# Patient Record
Sex: Male | Born: 1955 | Race: Black or African American | Hispanic: No | Marital: Single | Smoking: Never smoker
Health system: Southern US, Community
[De-identification: ages and names within clinical notes are randomized; demographics above are authoritative.]

## PROBLEM LIST (undated history)

## (undated) DIAGNOSIS — N529 Male erectile dysfunction, unspecified: Secondary | ICD-10-CM

## (undated) DIAGNOSIS — M21619 Bunion of unspecified foot: Secondary | ICD-10-CM

## (undated) DIAGNOSIS — K219 Gastro-esophageal reflux disease without esophagitis: Secondary | ICD-10-CM

## (undated) DIAGNOSIS — Z860100 Personal history of colon polyps, unspecified: Secondary | ICD-10-CM

## (undated) DIAGNOSIS — S82842A Displaced bimalleolar fracture of left lower leg, initial encounter for closed fracture: Secondary | ICD-10-CM

## (undated) DIAGNOSIS — K759 Inflammatory liver disease, unspecified: Secondary | ICD-10-CM

## (undated) DIAGNOSIS — J309 Allergic rhinitis, unspecified: Secondary | ICD-10-CM

## (undated) DIAGNOSIS — M109 Gout, unspecified: Secondary | ICD-10-CM

## (undated) DIAGNOSIS — M1990 Unspecified osteoarthritis, unspecified site: Secondary | ICD-10-CM

## (undated) DIAGNOSIS — E785 Hyperlipidemia, unspecified: Secondary | ICD-10-CM

## (undated) HISTORY — DX: Allergic rhinitis, unspecified: J30.9

## (undated) HISTORY — PX: FOOT SURGERY: SHX648

## (undated) HISTORY — DX: Gastro-esophageal reflux disease without esophagitis: K21.9

## (undated) HISTORY — DX: Personal history of colonic polyps: Z86.010

## (undated) HISTORY — DX: Personal history of colon polyps, unspecified: Z86.0100

## (undated) HISTORY — DX: Bunion of unspecified foot: M21.619

## (undated) HISTORY — PX: OTHER SURGICAL HISTORY: SHX169

## (undated) HISTORY — DX: Male erectile dysfunction, unspecified: N52.9

---

## 2005-09-02 ENCOUNTER — Emergency Department (HOSPITAL_COMMUNITY): Admission: EM | Admit: 2005-09-02 | Discharge: 2005-09-02 | Payer: Self-pay | Admitting: Family Medicine

## 2007-08-31 ENCOUNTER — Encounter: Payer: Self-pay | Admitting: Family Medicine

## 2008-03-27 ENCOUNTER — Emergency Department (HOSPITAL_COMMUNITY): Admission: EM | Admit: 2008-03-27 | Discharge: 2008-03-27 | Payer: Self-pay | Admitting: Emergency Medicine

## 2008-04-03 ENCOUNTER — Emergency Department (HOSPITAL_COMMUNITY): Admission: EM | Admit: 2008-04-03 | Discharge: 2008-04-03 | Payer: Self-pay | Admitting: Family Medicine

## 2008-08-25 ENCOUNTER — Ambulatory Visit: Payer: Self-pay | Admitting: Family Medicine

## 2008-08-25 DIAGNOSIS — F329 Major depressive disorder, single episode, unspecified: Secondary | ICD-10-CM | POA: Insufficient documentation

## 2008-09-25 ENCOUNTER — Ambulatory Visit: Payer: Self-pay | Admitting: Family Medicine

## 2008-09-25 DIAGNOSIS — K219 Gastro-esophageal reflux disease without esophagitis: Secondary | ICD-10-CM | POA: Insufficient documentation

## 2008-09-25 DIAGNOSIS — Z8711 Personal history of peptic ulcer disease: Secondary | ICD-10-CM | POA: Insufficient documentation

## 2008-09-25 DIAGNOSIS — M129 Arthropathy, unspecified: Secondary | ICD-10-CM | POA: Insufficient documentation

## 2008-09-29 ENCOUNTER — Encounter: Payer: Self-pay | Admitting: Family Medicine

## 2008-10-09 ENCOUNTER — Telehealth: Payer: Self-pay | Admitting: Family Medicine

## 2009-08-12 ENCOUNTER — Emergency Department (HOSPITAL_COMMUNITY): Admission: EM | Admit: 2009-08-12 | Discharge: 2009-08-12 | Payer: Self-pay | Admitting: Emergency Medicine

## 2009-09-26 ENCOUNTER — Emergency Department (HOSPITAL_COMMUNITY): Admission: EM | Admit: 2009-09-26 | Discharge: 2009-09-26 | Payer: Self-pay | Admitting: Family Medicine

## 2009-11-12 ENCOUNTER — Emergency Department (HOSPITAL_COMMUNITY): Admission: EM | Admit: 2009-11-12 | Discharge: 2009-11-12 | Payer: Self-pay | Admitting: Emergency Medicine

## 2009-11-12 ENCOUNTER — Ambulatory Visit: Payer: Self-pay | Admitting: Cardiology

## 2010-06-29 NOTE — Consult Note (Signed)
Summary: [facility]  [facility]   Imported By: Marylou Mccoy 12/24/2009 08:55:27  _____________________________________________________________________  External Attachment:    Type:   Image     Comment:   External Document

## 2010-07-18 ENCOUNTER — Encounter: Payer: Self-pay | Admitting: *Deleted

## 2010-08-15 LAB — POCT I-STAT, CHEM 8
BUN: 13 mg/dL (ref 6–23)
Calcium, Ion: 1.05 mmol/L — ABNORMAL LOW (ref 1.12–1.32)
Chloride: 106 mEq/L (ref 96–112)
Creatinine, Ser: 1.1 mg/dL (ref 0.4–1.5)
Glucose, Bld: 105 mg/dL — ABNORMAL HIGH (ref 70–99)
HCT: 40 % (ref 39.0–52.0)
Hemoglobin: 13.6 g/dL (ref 13.0–17.0)
Potassium: 3.7 mEq/L (ref 3.5–5.1)
Sodium: 139 mEq/L (ref 135–145)
TCO2: 24 mmol/L (ref 0–100)

## 2010-08-15 LAB — D-DIMER, QUANTITATIVE (NOT AT ARMC): D-Dimer, Quant: 0.35 ug/mL-FEU (ref 0.00–0.48)

## 2010-08-15 LAB — POCT CARDIAC MARKERS
CKMB, poc: 1.6 ng/mL (ref 1.0–8.0)
Myoglobin, poc: 176 ng/mL (ref 12–200)
Troponin i, poc: <0.05 ng/mL (ref 0.00–0.09)

## 2010-08-15 LAB — DIFFERENTIAL
Basophils Absolute: 0 10*3/uL (ref 0.0–0.1)
Basophils Relative: 1 % (ref 0–1)
Eosinophils Absolute: 0.1 10*3/uL (ref 0.0–0.7)
Eosinophils Relative: 3 % (ref 0–5)
Lymphocytes Relative: 35 % (ref 12–46)
Lymphs Abs: 1.7 10*3/uL (ref 0.7–4.0)
Monocytes Absolute: 0.6 10*3/uL (ref 0.1–1.0)
Monocytes Relative: 12 % (ref 3–12)
Neutro Abs: 2.4 10*3/uL (ref 1.7–7.7)
Neutrophils Relative %: 50 % (ref 43–77)

## 2010-08-15 LAB — CBC
HCT: 39.2 % (ref 39.0–52.0)
Hemoglobin: 13.3 g/dL (ref 13.0–17.0)
MCHC: 34 g/dL (ref 30.0–36.0)
MCV: 85.8 fL (ref 78.0–100.0)
Platelets: 162 10*3/uL (ref 150–400)
RBC: 4.57 MIL/uL (ref 4.22–5.81)
RDW: 13.9 % (ref 11.5–15.5)
WBC: 4.9 10*3/uL (ref 4.0–10.5)

## 2010-08-15 LAB — CK TOTAL AND CKMB (NOT AT ARMC)
CK, MB: 2.3 ng/mL (ref 0.3–4.0)
CK, MB: 3.1 ng/mL (ref 0.3–4.0)
Relative Index: 0.2 (ref 0.0–2.5)
Relative Index: 0.2 (ref 0.0–2.5)
Total CK: 1026 U/L — ABNORMAL HIGH (ref 7–232)
Total CK: 1283 U/L — ABNORMAL HIGH (ref 7–232)

## 2010-08-15 LAB — RAPID URINE DRUG SCREEN, HOSP PERFORMED
Amphetamines: NOT DETECTED
Barbiturates: NOT DETECTED
Benzodiazepines: NOT DETECTED
Cocaine: NOT DETECTED
Opiates: NOT DETECTED
Tetrahydrocannabinol: NOT DETECTED

## 2010-08-15 LAB — TROPONIN I
Troponin I: 0.01 ng/mL (ref 0.00–0.06)
Troponin I: 0.02 ng/mL (ref 0.00–0.06)

## 2013-09-16 ENCOUNTER — Emergency Department (HOSPITAL_COMMUNITY)
Admission: EM | Admit: 2013-09-16 | Discharge: 2013-09-16 | Disposition: A | Discharge status: Home / Self Care | Payer: Medicaid Other | Source: Home / Self Care | Attending: Family Medicine | Admitting: Family Medicine

## 2013-09-16 ENCOUNTER — Encounter (HOSPITAL_COMMUNITY): Payer: Self-pay | Admitting: Emergency Medicine

## 2013-09-16 DIAGNOSIS — J309 Allergic rhinitis, unspecified: Secondary | ICD-10-CM

## 2013-09-16 DIAGNOSIS — J301 Allergic rhinitis due to pollen: Secondary | ICD-10-CM

## 2013-09-16 MED ORDER — ALBUTEROL SULFATE HFA 108 (90 BASE) MCG/ACT IN AERS: 2.0000 | INHALATION_SPRAY | RESPIRATORY_TRACT | Status: DC | PRN

## 2013-09-16 MED ORDER — OLOPATADINE HCL 0.2 % OP SOLN
OPHTHALMIC | Status: DC
Start: 2013-09-16 — End: 2016-01-28

## 2013-09-16 MED ORDER — METHYLPREDNISOLONE 4 MG PO KIT: PACK | ORAL | Status: DC

## 2013-09-16 MED ORDER — AEROCHAMBER PLUS FLO-VU LARGE MISC: 1.0000 | Freq: Once | Status: DC

## 2013-09-16 MED ORDER — CHLORPHENIRAMINE-PSE-IBUPROFEN 2-30-200 MG PO TABS: ORAL_TABLET | ORAL | Status: DC

## 2013-09-16 MED ORDER — FLUTICASONE PROPIONATE 50 MCG/ACT NA SUSP: 2.0000 | Freq: Two times a day (BID) | NASAL | Status: DC

## 2013-09-16 NOTE — Discharge Instructions (Signed)
Hay Fever Hay fever is an allergic reaction to particles in the air. It cannot be passed from person to person. It cannot be cured, but it can be controlled. CAUSES  Hay fever is caused by something that triggers an allergic reaction (allergens). The following are examples of allergens:  Ragweed.  Feathers.  Animal dander.  Grass and tree pollens.  Cigarette smoke.  House dust.  Pollution. SYMPTOMS   Sneezing.  Runny or stuffy nose.  Tearing eyes.  Itchy eyes, nose, mouth, throat, skin, or other area.  Sore throat.  Headache.  Decreased sense of smell or taste. DIAGNOSIS Your caregiver will perform a physical exam and ask questions about the symptoms you are having.Allergy testing may be done to determine exactly what triggers your hay fever.  TREATMENT   Over-the-counter medicines may help symptoms. These include:  Antihistamines.  Decongestants. These may help with nasal congestion.  Your caregiver may prescribe medicines if over-the-counter medicines do not work.  Some people benefit from allergy shots when other medicines are not helpful. HOME CARE INSTRUCTIONS   Avoid the allergen that is causing your symptoms, if possible.  Take all medicine as told by your caregiver. SEEK MEDICAL CARE IF:   You have severe allergy symptoms and your current medicines are not helping.  Your treatment was working at one time, but you are now experiencing symptoms.  You have sinus congestion and pressure.  You develop a fever or headache.  You have thick nasal discharge.  You have asthma and have a worsening cough and wheezing. SEEK IMMEDIATE MEDICAL CARE IF:   You have swelling of your tongue or lips.  You have trouble breathing.  You feel lightheaded or like you are going to faint.  You have cold sweats.  You have a fever. Document Released: 05/16/2005 Document Revised: 08/08/2011 Document Reviewed: 08/11/2010 Exit[employer] Patient Information 2014  [employer].  

## 2013-09-16 NOTE — ED Provider Notes (Signed)
CSN: [id number]     Arrival date & time 09/16/13  1033 History   First MD Initiated Contact with Patient 09/16/13 1140     Chief Complaint  Patient presents with  . URI   (Consider location/radiation/quality/duration/timing/severity/associated sxs/prior Treatment) HPI Comments: 58 year old male presents complaining of nasal congestion, rhinorrhea, sneezing, cough, scratchy throat, itching in his ears, intermittent headache for 2 weeks. His symptoms have been constant, not worsening or improving. He also admits to some wheezing when he first wakes up in the morning. He has been treating with Claritin and with Benadryl, these help his symptoms temporarily but then his symptoms return. He denies fever, chills, NVD, shortness of breath.  Patient is a 58 y.o. male presenting with URI.  URI Presenting symptoms: congestion, cough and rhinorrhea   Presenting symptoms: no ear pain, no fatigue, no fever and no sore throat   Associated symptoms: sneezing and wheezing     History reviewed. No pertinent past medical history. History reviewed. No pertinent past surgical history. No family history on file. History  Substance Use Topics  . Smoking status: Never Smoker   . Smokeless tobacco: Not on file  . Alcohol Use: No    Review of Systems  Constitutional: Negative for fever, chills and fatigue.  HENT: Positive for congestion, postnasal drip, rhinorrhea, sinus pressure and sneezing. Negative for ear pain, nosebleeds, sore throat and voice change.        See HPI  Eyes: Positive for discharge and itching.  Respiratory: Positive for cough and wheezing. Negative for chest tightness and shortness of breath.   Cardiovascular: Negative for chest pain and leg swelling.  Skin: Negative for rash.  All other systems reviewed and are negative.   Allergies  Review of patient's allergies indicates no known allergies.  Home Medications   Prior to Admission medications   Medication Sig Start Date End  Date Taking? Authorizing Provider  acetaminophen (TYLENOL 8 HOUR) 650 MG CR tablet Take 650 mg by mouth every 8 (eight) hours as needed.      Historical Provider, MD  albuterol (PROVENTIL HFA;VENTOLIN HFA) 108 (90 BASE) MCG/ACT inhaler Inhale 2 puffs into the lungs every 4 (four) hours as needed for wheezing. 09/16/13   Graylon GoodZachary H Jarrah Seher, PA-C  Chlorpheniramine-PSE-Ibuprofen (ADVIL ALLERGY SINUS) 2-30-200 MG TABS 1-2 tabs PO Q4-6 hrs PRN 09/16/13   Graylon GoodZachary H Emmanuelle Coxe, PA-C  fluticasone (FLONASE) 50 MCG/ACT nasal spray Place 2 sprays into both nostrils 2 (two) times daily. Decrease to 2 sprays/nostril daily after 5 days 09/16/13   Graylon GoodZachary H Hadriel Northup, PA-C  methylPREDNISolone (MEDROL DOSEPAK) 4 MG tablet Use as directed on package instructions 09/16/13   Graylon GoodZachary H Einer Meals, PA-C  Olopatadine HCl (PATADAY) 0.2 % SOLN 1 drop per eye once daily as needed for redness, itching, or irritation 09/16/13   Graylon GoodZachary H Mclain Freer, PA-C  Omeprazole ([employer] OMEPRAZOLE) 20 MG TBEC Take 1 tablet by mouth every morning.      Historical Provider, MD  Spacer/Aero-Holding Chambers (AEROCHAMBER PLUS FLO-VU LARGE) MISC 1 each by Other route once. 09/16/13   Adrian BlackwaterZachary H Jozette Castrellon, PA-C   BP 138/75  Pulse 66  Temp(Src) 98.4 F (36.9 C) (Oral)  Resp 18  SpO2 97% Physical Exam  Nursing note and vitals reviewed. Constitutional: He is oriented to person, place, and time. He appears well-developed and well-nourished. No distress.  HENT:  Head: Normocephalic and atraumatic.  Right Ear: Tympanic membrane, external ear and ear canal normal.  Left Ear: Tympanic membrane, external ear and ear canal  normal.  Nose: Mucosal edema and rhinorrhea present. Right sinus exhibits no maxillary sinus tenderness and no frontal sinus tenderness. Left sinus exhibits no maxillary sinus tenderness and no frontal sinus tenderness.  Mouth/Throat: Uvula is midline, oropharynx is clear and moist and mucous membranes are normal. No oropharyngeal exudate.  Eyes: Conjunctivae are  normal. Right eye exhibits no discharge. Left eye exhibits no discharge.  Neck: Normal range of motion. Neck supple.  Cardiovascular: Normal rate, regular rhythm and normal heart sounds.   Pulmonary/Chest: Effort normal and breath sounds normal. No respiratory distress.  Lymphadenopathy:    He has no cervical adenopathy.  Neurological: He is alert and oriented to person, place, and time. Coordination normal.  Skin: Skin is warm and dry. No rash noted. He is not diaphoretic.  Psychiatric: He has a normal mood and affect. Judgment normal.    ED Course  Procedures (including critical care time) Labs Review Labs Reviewed - No data to display   Imaging Review No results found.   MDM   1. Hay fever    Vitals normal, no specific signs of sinusitis.  Treat symptomatically.  F/u if not improving.    Discharge Medication List as of 09/16/2013 11:51 AM    START taking these medications   Details  albuterol (PROVENTIL HFA;VENTOLIN HFA) 108 (90 BASE) MCG/ACT inhaler Inhale 2 puffs into the lungs every 4 (four) hours as needed for wheezing., Starting 09/16/2013, Until Discontinued, Normal    Chlorpheniramine-PSE-Ibuprofen (ADVIL ALLERGY SINUS) 2-30-200 MG TABS 1-2 tabs PO Q4-6 hrs PRN, Normal    fluticasone (FLONASE) 50 MCG/ACT nasal spray Place 2 sprays into both nostrils 2 (two) times daily. Decrease to 2 sprays/nostril daily after 5 days, Starting 09/16/2013, Until Discontinued, Normal    methylPREDNISolone (MEDROL DOSEPAK) 4 MG tablet Use as directed on package instructions, Normal    Olopatadine HCl (PATADAY) 0.2 % SOLN 1 drop per eye once daily as needed for redness, itching, or irritation, Normal    Spacer/Aero-Holding Chambers (AEROCHAMBER PLUS FLO-VU LARGE) MISC 1 each by Other route once., Starting 09/16/2013, Normal           Graylon GoodZachary H Ocie Tino, PA-C 09/16/13 1204

## 2013-09-16 NOTE — ED Notes (Signed)
Pt c/o cold/sinus sx onset 2 weeks Sx include: productive cough, SOB, wheezing, itchy ears, facial pressure, HA Denies f/v/n/d Taking OTC cold meds w/no relief Alert w/no signs of acute distress.

## 2013-09-18 NOTE — ED Provider Notes (Signed)
Medical screening examination/treatment/procedure(s) were performed by a resident physician or non-physician practitioner and as the supervising physician I was immediately available for consultation/collaboration.  Drexler Maland, MD    Raysa Bosak S Wakeelah Solan, MD 09/18/13 1326 

## 2014-06-16 ENCOUNTER — Encounter (HOSPITAL_COMMUNITY): Payer: Self-pay | Admitting: Emergency Medicine

## 2014-06-16 ENCOUNTER — Emergency Department (HOSPITAL_COMMUNITY): Payer: Medicaid Other

## 2014-06-16 ENCOUNTER — Emergency Department (HOSPITAL_COMMUNITY)
Admission: EM | Admit: 2014-06-16 | Discharge: 2014-06-16 | Disposition: A | Discharge status: Home / Self Care | Payer: Medicaid Other | Attending: Emergency Medicine | Admitting: Emergency Medicine

## 2014-06-16 DIAGNOSIS — Z79899 Other long term (current) drug therapy: Secondary | ICD-10-CM | POA: Insufficient documentation

## 2014-06-16 DIAGNOSIS — S8991XA Unspecified injury of right lower leg, initial encounter: Secondary | ICD-10-CM

## 2014-06-16 DIAGNOSIS — Z8639 Personal history of other endocrine, nutritional and metabolic disease: Secondary | ICD-10-CM | POA: Insufficient documentation

## 2014-06-16 DIAGNOSIS — S46911A Strain of unspecified muscle, fascia and tendon at shoulder and upper arm level, right arm, initial encounter: Secondary | ICD-10-CM | POA: Diagnosis not present

## 2014-06-16 DIAGNOSIS — Y92410 Unspecified street and highway as the place of occurrence of the external cause: Secondary | ICD-10-CM | POA: Insufficient documentation

## 2014-06-16 DIAGNOSIS — Y9389 Activity, other specified: Secondary | ICD-10-CM | POA: Diagnosis not present

## 2014-06-16 DIAGNOSIS — S199XXA Unspecified injury of neck, initial encounter: Secondary | ICD-10-CM | POA: Insufficient documentation

## 2014-06-16 DIAGNOSIS — Z7951 Long term (current) use of inhaled steroids: Secondary | ICD-10-CM | POA: Insufficient documentation

## 2014-06-16 DIAGNOSIS — Y998 Other external cause status: Secondary | ICD-10-CM | POA: Diagnosis not present

## 2014-06-16 DIAGNOSIS — S46811A Strain of other muscles, fascia and tendons at shoulder and upper arm level, right arm, initial encounter: Secondary | ICD-10-CM

## 2014-06-16 HISTORY — DX: Hyperlipidemia, unspecified: E78.5

## 2014-06-16 MED ORDER — HYDROCODONE-ACETAMINOPHEN 5-325 MG PO TABS
2.0000 | ORAL_TABLET | Freq: Once | ORAL | Status: AC
  Administered 2014-06-16: 2 via ORAL
  Filled 2014-06-16: qty 2

## 2014-06-16 MED ORDER — HYDROCODONE-ACETAMINOPHEN 5-325 MG PO TABS: 2.0000 | ORAL_TABLET | ORAL | Status: DC | PRN

## 2014-06-16 MED ORDER — CYCLOBENZAPRINE HCL 10 MG PO TABS
10.0000 mg | ORAL_TABLET | Freq: Once | ORAL | Status: AC
  Administered 2014-06-16: 10 mg via ORAL
  Filled 2014-06-16: qty 1

## 2014-06-16 MED ORDER — CYCLOBENZAPRINE HCL 10 MG PO TABS: 10.0000 mg | ORAL_TABLET | Freq: Two times a day (BID) | ORAL | Status: DC | PRN
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
## 2014-06-16 NOTE — ED Notes (Signed)
Declined W/C at D/C and was escorted to lobby by RN. 

## 2014-06-16 NOTE — Discharge Instructions (Signed)
Take Vicodin as needed for pain. Take Flexeril as needed for muscle spasm. Refer to attached documents for more information.  °
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
## 2014-09-20 ENCOUNTER — Emergency Department (HOSPITAL_COMMUNITY)
Admission: EM | Admit: 2014-09-20 | Discharge: 2014-09-20 | Disposition: A | Discharge status: Home / Self Care | Payer: Medicaid Other | Attending: Emergency Medicine | Admitting: Emergency Medicine

## 2014-09-20 ENCOUNTER — Emergency Department (HOSPITAL_COMMUNITY): Payer: Medicaid Other

## 2014-09-20 ENCOUNTER — Encounter (HOSPITAL_COMMUNITY): Payer: Self-pay | Admitting: Emergency Medicine

## 2014-09-20 DIAGNOSIS — B349 Viral infection, unspecified: Secondary | ICD-10-CM | POA: Insufficient documentation

## 2014-09-20 DIAGNOSIS — H9203 Otalgia, bilateral: Secondary | ICD-10-CM | POA: Diagnosis not present

## 2014-09-20 DIAGNOSIS — Z79899 Other long term (current) drug therapy: Secondary | ICD-10-CM | POA: Diagnosis not present

## 2014-09-20 DIAGNOSIS — Z7951 Long term (current) use of inhaled steroids: Secondary | ICD-10-CM | POA: Diagnosis not present

## 2014-09-20 DIAGNOSIS — R52 Pain, unspecified: Secondary | ICD-10-CM | POA: Diagnosis present

## 2014-09-20 DIAGNOSIS — Z7952 Long term (current) use of systemic steroids: Secondary | ICD-10-CM | POA: Diagnosis not present

## 2014-09-20 DIAGNOSIS — Z8639 Personal history of other endocrine, nutritional and metabolic disease: Secondary | ICD-10-CM | POA: Insufficient documentation

## 2014-09-20 DIAGNOSIS — R062 Wheezing: Secondary | ICD-10-CM

## 2014-09-20 MED ORDER — KETOROLAC TROMETHAMINE 60 MG/2ML IM SOLN
60.0000 mg | Freq: Once | INTRAMUSCULAR | Status: AC
  Administered 2014-09-20: 60 mg via INTRAMUSCULAR
  Filled 2014-09-20: qty 2

## 2014-09-20 MED ORDER — MOMETASONE FUROATE 50 MCG/ACT NA SUSP: 2.0000 | Freq: Every day | NASAL | Status: DC

## 2014-09-20 MED ORDER — PREDNISONE 20 MG PO TABS: ORAL_TABLET | ORAL | Status: DC

## 2014-09-20 MED ORDER — PREDNISONE 20 MG PO TABS
50.0000 mg | ORAL_TABLET | Freq: Once | ORAL | Status: DC
  Filled 2014-09-20: qty 3

## 2014-09-20 MED ORDER — ALBUTEROL SULFATE HFA 108 (90 BASE) MCG/ACT IN AERS
2.0000 | INHALATION_SPRAY | Freq: Once | RESPIRATORY_TRACT | Status: AC
  Administered 2014-09-20: 2 via RESPIRATORY_TRACT
  Filled 2014-09-20: qty 6.7

## 2014-09-20 MED ORDER — PREDNISONE 20 MG PO TABS
60.0000 mg | ORAL_TABLET | Freq: Once | ORAL | Status: AC
  Administered 2014-09-20: 60 mg via ORAL

## 2014-09-20 NOTE — ED Notes (Signed)
Pt. Stated, I've been sick since Monday with ear pain, body aches, cough, feeling tired and in bed all week.

## 2014-09-20 NOTE — Discharge Instructions (Signed)
Return to the emergency room with worsening of symptoms, new symptoms or with symptoms that are concerning , especially fevers, stiff neck, worsening headache, nausea/vomiting, visual changes or slurred speech, chest pain, shortness of breath, cough with thick colored mucous or blood Drink plenty of fluids with electrolytes especially Gatorade. OTC cold medications such as mucinex, nyquil, dayquil are recommended. Chloraseptic for sore throat. Follow-up with her primary care doctor in 3 days as scheduled. Take prednisone daily for the next 4 days as well as albuterol for wheezing. Read below information and follow recommendations. Bronchospasm A bronchospasm is when the tubes that carry air in and out of your lungs (airways) spasm or tighten. During a bronchospasm it is hard to breathe. This is because the airways get smaller. A bronchospasm can be triggered by:  Allergies. These may be to animals, pollen, food, or mold.  Infection. This is a common cause of bronchospasm.  Exercise.  Irritants. These include pollution, cigarette smoke, strong odors, aerosol sprays, and paint fumes.  Weather changes.  Stress.  Being emotional. HOME CARE   Always have a plan for getting help. Know when to call your doctor and local emergency services (911 in the [place] Know where you can get emergency care.  Only take medicines as told by your doctor.  If you were prescribed an inhaler or nebulizer machine, ask your doctor how to use it correctly. Always use a spacer with your inhaler if you were given one.  Stay calm during an attack. Try to relax and breathe more slowly.  Control your home environment:  Change your heating and air conditioning filter at least once a month.  Limit your use of fireplaces and wood stoves.  Do not  smoke. Do not  allow smoking in your home.  Avoid perfumes and fragrances.  Get rid of pests (such as roaches and mice) and their droppings.  Throw away plants if  you see mold on them.  Keep your house clean and dust free.  Replace carpet with wood, tile, or vinyl flooring. Carpet can trap dander and dust.  Use allergy-proof pillows, mattress covers, and box spring covers.  Wash bed sheets and blankets every week in hot water. Dry them in a dryer.  Use blankets that are made of polyester or cotton.  Wash hands frequently. GET HELP IF:  You have muscle aches.  You have chest pain.  The thick spit you spit or cough up (sputum) changes from clear or white to yellow, green, gray, or bloody.  The thick spit you spit or cough up gets thicker.  There are problems that may be related to the medicine you are given such as:  A rash.  Itching.  Swelling.  Trouble breathing. GET HELP RIGHT AWAY IF:  You feel you cannot breathe or catch your breath.  You cannot stop coughing.  Your treatment is not helping you breathe better.  You have very bad chest pain. MAKE SURE YOU:   Understand these instructions.  Will watch your condition.  Will get help right away if you are not doing well or get worse. Document Released: 03/13/2009 Document Revised: 05/21/2013 Document Reviewed: 11/06/2012 [facility] Patient Information 2015 [place], [place]. This information is not intended to replace advice given to you by your health care provider. Make sure you discuss any questions you have with your health care provider.

## 2014-09-20 NOTE — ED Notes (Signed)
C/o headache and bilateral earache and cough since Monday-- with fever.

## 2014-09-20 NOTE — ED Provider Notes (Signed)
CSN: [id number]     Arrival date & time 09/20/14  [id number] History   First MD Initiated Contact with Patient 09/20/14 [phone]     Chief Complaint  Patient presents with  . flu symptoms   . Otalgia  . Headache  . Generalized Body Aches     (Consider location/radiation/quality/duration/timing/severity/associated sxs/prior Treatment) HPI  Jason Hamilton is a 59 y.o. male with PMH of hyperlipidemia presenting with 6 day history of headache worse with cough as well as bilateral earache and sputum is thick and colored. He also reports subjective fevers. He has been taking over-the-counter cold medicines without significant relief. He reports generalized body aches. He denies any other medical history. No visual changes, slurred speech, focal weakness. No nausea or vomiting.   Past Medical History  Diagnosis Date  . Hyperlipidemia    History reviewed. No pertinent past surgical history. No family history on file. History  Substance Use Topics  . Smoking status: Never Smoker   . Smokeless tobacco: Not on file  . Alcohol Use: No    Review of Systems 10 Systems reviewed and are negative for acute change except as noted in the HPI.    Allergies  Review of patient's allergies indicates no known allergies.  Home Medications   Prior to Admission medications   Medication Sig Start Date End Date Taking? Authorizing Provider  acetaminophen (TYLENOL 8 HOUR) 650 MG CR tablet Take 650 mg by mouth every 8 (eight) hours as needed.     Yes Historical Provider, MD  cetirizine (ZYRTEC) 10 MG tablet Take 10 mg by mouth daily. 08/22/14  Yes Historical Provider, MD  HYDROcodone-acetaminophen (NORCO/VICODIN) 5-325 MG per tablet Take 2 tablets by mouth every 4 (four) hours as needed for moderate pain or severe pain. 06/16/14  Yes Kaitlyn Szekalski, PA-C  Omeprazole ([facility] OMEPRAZOLE) 20 MG TBEC Take 1 tablet by mouth every morning.     Yes Historical Provider, MD  albuterol (PROVENTIL HFA;VENTOLIN HFA) 108  (90 BASE) MCG/ACT inhaler Inhale 2 puffs into the lungs every 4 (four) hours as needed for wheezing. Patient not taking: Reported on 09/20/2014 09/16/13   Graylon GoodZachary H Baker, PA-C  Chlorpheniramine-PSE-Ibuprofen (ADVIL ALLERGY SINUS) 2-30-200 MG TABS 1-2 tabs PO Q4-6 hrs PRN Patient not taking: Reported on 09/20/2014 09/16/13   Graylon GoodZachary H Baker, PA-C  cyclobenzaprine (FLEXERIL) 10 MG tablet Take 1 tablet (10 mg total) by mouth 2 (two) times daily as needed for muscle spasms. Patient not taking: Reported on 09/20/2014 06/16/14   Emilia BeckKaitlyn Szekalski, PA-C  fluticasone [facility](FLONASE) 50 MCG/ACT nasal spray Place 2 sprays into both nostrils 2 (two) times daily. Decrease to 2 sprays/nostril daily after 5 days Patient not taking: Reported on 09/20/2014 09/16/13   Graylon GoodZachary H Baker, PA-C  methylPREDNISolone (MEDROL DOSEPAK) 4 MG tablet Use as directed on package instructions Patient not taking: Reported on 09/20/2014 09/16/13   Graylon GoodZachary H Baker, PA-C  mometasone (NASONEX) 50 MCG/ACT nasal spray Place 2 sprays into the nose daily. 09/20/14   Oswaldo ConroyVictoria Liv Rallis, PA-C  Olopatadine HCl (PATADAY) 0.2 % SOLN 1 drop per eye once daily as needed for redness, itching, or irritation Patient not taking: Reported on 09/20/2014 09/16/13   Graylon GoodZachary H Baker, PA-C  predniSONE (DELTASONE) 20 MG tablet 2 tabs po daily x 4 days 09/20/14   Oswaldo ConroyVictoria Ja Ohman, PA-C  Spacer/Aero-Holding Chambers (AEROCHAMBER PLUS FLO-VU LARGE) MISC 1 each by Other route once. Patient not taking: Reported on 09/20/2014 09/16/13   Graylon GoodZachary H Baker, PA-C   BP 115/79 mmHg  Pulse 65  Resp 14  Ht 5' 7.5" (1.715 m)  Wt 229 lb 3 oz (103.959 kg)  BMI 35.35 kg/m2  SpO2 99% Physical Exam  Constitutional: He is oriented to person, place, and time. He appears well-developed and well-nourished. No distress.  HENT:  Head: Normocephalic and atraumatic.  Nose: Right sinus exhibits no maxillary sinus tenderness and no frontal sinus tenderness. Left sinus exhibits no maxillary sinus  tenderness and no frontal sinus tenderness.  Mouth/Throat: Mucous membranes are normal. Posterior oropharyngeal edema and posterior oropharyngeal erythema present. No oropharyngeal exudate.  No trismus or uvula deviation Bilateral TM normal  Eyes: Conjunctivae and EOM are normal. Pupils are equal, round, and reactive to light. Right eye exhibits no discharge. Left eye exhibits no discharge.  Neck: Normal range of motion. Neck supple.  Cardiovascular: Normal rate, regular rhythm and normal heart sounds.   Pulmonary/Chest: Effort normal. No respiratory distress. He has wheezes.  Diffuse wheezing worse on left.  Abdominal: Soft. Bowel sounds are normal. He exhibits no distension. There is no tenderness.  Lymphadenopathy:    He has cervical adenopathy.  Neurological: He is alert and oriented to person, place, and time.  Patient moves all extremities without focal neurological deficits. No facial droop. Normal gait.  Skin: Skin is warm and dry. He is not diaphoretic.  Nursing note and vitals reviewed.   ED Course  Procedures (including critical care time) Labs Review Labs Reviewed - No data to display  Imaging Review Dg Chest 2 View  09/20/2014   CLINICAL DATA:  Cough, myalgia. Symptoms for 5 days. Initial encounter.  EXAM: CHEST  2 VIEW  COMPARISON:  11/12/2009  FINDINGS: The heart size and mediastinal contours are within normal limits. Both lungs are clear. The visualized skeletal structures are unremarkable.  IMPRESSION: No active cardiopulmonary disease.   Electronically Signed   By: Christiana Pellant M.D.   On: 09/20/2014 11:44     EKG Interpretation None      Meds given in ED:  Medications  albuterol (PROVENTIL HFA;VENTOLIN HFA) 108 (90 BASE) MCG/ACT inhaler 2 puff (2 puffs Inhalation Given 09/20/14 1107)  ketorolac (TORADOL) injection 60 mg (60 mg Intramuscular Given 09/20/14 1109)  predniSONE (DELTASONE) tablet 60 mg (60 mg Oral Given 09/20/14 1131)    New Prescriptions    MOMETASONE (NASONEX) 50 MCG/ACT NASAL SPRAY    Place 2 sprays into the nose daily.   PREDNISONE (DELTASONE) 20 MG TABLET    2 tabs po daily x 4 days      MDM   Final diagnoses:  Viral syndrome  Wheezing   Patient presenting with 6 day history of subjective fevers as well as cough headache, myalgias and ear pain. VSS. Temperature 98.8. Patient with diffuse wheezing. He denies history of smoking. He has been given an inhaler in the ED with improvement of the symptoms as well as prednisone. Chest x-ray without evidence of infiltrate. Patient given burst of prednisone as follow-up with primary care in 3 days as scheduled. Patient likely with viral syndrome. Patient nonseptic nontoxic appearing and is stable for outpatient management and discharge.  Discussed return precautions with patient. Discussed all results and patient verbalizes understanding and agrees with plan.   Oswaldo Conroy, PA-C 09/20/14 1156  Layla Maw Ward, DO 09/20/14 1528

## 2015-06-02 ENCOUNTER — Encounter: Payer: Self-pay | Admitting: Podiatry

## 2015-06-02 ENCOUNTER — Ambulatory Visit (INDEPENDENT_AMBULATORY_CARE_PROVIDER_SITE_OTHER): Payer: Medicaid Other

## 2015-06-02 ENCOUNTER — Ambulatory Visit: Payer: Medicaid Other

## 2015-06-02 ENCOUNTER — Ambulatory Visit (INDEPENDENT_AMBULATORY_CARE_PROVIDER_SITE_OTHER): Payer: Medicaid Other | Admitting: Podiatry

## 2015-06-02 VITALS — BP 150/71 | HR 76 | Resp 16

## 2015-06-02 DIAGNOSIS — M205X2 Other deformities of toe(s) (acquired), left foot: Secondary | ICD-10-CM

## 2015-06-02 DIAGNOSIS — M7750 Other enthesopathy of unspecified foot: Secondary | ICD-10-CM

## 2015-06-02 DIAGNOSIS — M2012 Hallux valgus (acquired), left foot: Secondary | ICD-10-CM

## 2015-06-02 NOTE — Progress Notes (Signed)
   Subjective:    Patient ID: Jason Hamilton, male    DOB: 1956/04/06, 60 y.o.   MRN: [id number]  HPI this 60 year old male presents to the office with pain in the big toe joints of both feet. He expresses more pain in his left than his right big toe. He says he has been dealing with aching in his feet for months in addition to swelling. He states he is having a hard time wearing his shoes during the pain in his foot. He also expresses cramping in the big toe joint of the left foot. He has provided no self treatment nor sought any professional help. He does admit he was very active playing basketball when he was younger and he presents the office today for an evaluation and treatment    Review of Systems  HENT: Positive for sinus pressure and sore throat.   Musculoskeletal: Positive for myalgias and arthralgias.  All other systems reviewed and are negative.      Objective:   Physical Exam GENERAL APPEARANCE: Alert, conversant. Appropriately groomed. No acute distress.  VASCULAR: Pedal pulses palpable at  [facility] bilateral.  Capillary refill time is immediate to all digits,  Normal temperature gradient.  Digital hair growth is present bilateral  NEUROLOGIC: sensation is normal to 5.07 monofilament at 5/5 sites bilateral.  Light touch is intact bilateral, Muscle strength normal.  MUSCULOSKELETAL: acceptable muscle strength, tone and stability bilateral.  Intrinsic muscluature intact bilateral.  Rectus appearance of foot and digits noted bilateral. Right big toe has limited motion about 10 degrees.  Crepitus noted.  Left big toe joint has no motion .  Bony prominence first metatarsal head both feet.  DERMATOLOGIC: skin color, texture, and turgor are within normal limits.  No preulcerative lesions or ulcers  are seen, no interdigital maceration noted.  No open lesions present.  Digital nails are asymptomatic. No drainage noted.        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  Discussed conservative vs. Surgical intervention.  He requested injection therapy left foot.  He requested an appointment with Dr. Al CorpusHyatt to discuss surgery.   Helane GuntherGregory Tanai Bouler DPM

## 2015-06-02 NOTE — Patient Instructions (Signed)
Hallux Limitus Hallux limitus is a condition involving pain and a loss of motion of the first (big) toe. The pain gets worse with lifting up (extension) of the toe. This is usually due to arthritic bony bumps (spurring) of the joint at the base of the big toe.  SYMPTOMS   Pain, with lifting up of the toe.  Tenderness over the joint where the big toe meets the foot.  Redness, swelling, and warmth over the top of the base of the big toe (sometimes).  Foot pain, stiffness, and limping. CAUSES  Hallux limitus is caused by arthritis of the joint where the big toe meets the foot. The arthritis creates a bone spur that pinches the soft tissues when the toe is extended. RISK INCREASES WITH:  Tight shoes with a narrow toe box.  Family history of foot problems.  Gout and rheumatoid and psoriatic arthritis.  History of previous toe injury, including "turf toe."  Long first toe, flat feet, and other big toe bony bumps.  Arthritis of the big toe. PREVENTION   Wear wide-toed shoes that fit well.  Tape the big toe to reduce motion and to prevent pinching of the tissues between the bone.  Maintain physical fitness:  Foot and ankle flexibility.  Muscle strength and endurance. PROGNOSIS  This condition can usually be managed with proper treatment. However, surgery is typically required to prevent the problem from recurring.  RELATED COMPLICATIONS  Injury to other areas of the foot or ankle, caused by abnormal walking in an attempt to avoid the pain felt when walking normally. TREATMENT Treatment first involves stopping the activities that aggravate your symptoms. Ice and medicine can be used to reduce the pain and inflammation. Modifications to shoes may help reduce pain, including wearing stiff-soled shoes, shoes with a wide toe box, inserting a padded donut to relieve pressure on top of the joint, or wearing an arch support. Corticosteroid injections may be given to reduce inflammation. If  nonsurgical treatment is unsuccessful, surgery may be needed. Surgical options include removing the arthritic bony spur, cutting a bone in the foot to change the arc of motion (allowing the toe to extend more), or fusion of the joint (eliminating all motion in the joint at the base of the big toe).  MEDICATION   If pain medicine is needed, nonsteroidal anti-inflammatory medicines (aspirin and ibuprofen), or other minor pain relievers (acetaminophen), are often advised.  Do not take pain medicine for 7 days before surgery.  Prescription pain relievers are usually prescribed only after surgery. Use only as directed and only as much as you need.  Ointments for arthritis, applied to the skin, may give some relief.  Injections of corticosteroids may be given to reduce inflammation. HEAT AND COLD  Cold treatment (icing) relieves pain and reduces inflammation. Cold treatment should be applied for 10 to 15 minutes every 2 to 3 hours, and immediately after activity that aggravates your symptoms. Use ice packs or an ice massage.  Heat treatment may be used before performing the stretching and strengthening activities prescribed by your caregiver, physical therapist, or athletic trainer. Use a heat pack or a warm water soak. SEEK MEDICAL CARE IF:   Symptoms get worse or do not improve in 2 weeks, despite treatment.  After surgery you develop fever, increasing pain, redness, swelling, drainage of fluids, bleeding, or increasing warmth.  New, unexplained symptoms develop.    

## 2016-01-28 ENCOUNTER — Emergency Department (HOSPITAL_COMMUNITY): Payer: Medicaid Other

## 2016-01-28 ENCOUNTER — Encounter (HOSPITAL_COMMUNITY): Payer: Self-pay | Admitting: *Deleted

## 2016-01-28 ENCOUNTER — Emergency Department (HOSPITAL_COMMUNITY)
Admission: EM | Admit: 2016-01-28 | Discharge: 2016-01-28 | Disposition: A | Discharge status: Home / Self Care | Payer: Medicaid Other | Attending: Emergency Medicine | Admitting: Emergency Medicine

## 2016-01-28 DIAGNOSIS — Y999 Unspecified external cause status: Secondary | ICD-10-CM | POA: Diagnosis not present

## 2016-01-28 DIAGNOSIS — M25572 Pain in left ankle and joints of left foot: Secondary | ICD-10-CM | POA: Diagnosis not present

## 2016-01-28 DIAGNOSIS — Y939 Activity, unspecified: Secondary | ICD-10-CM | POA: Insufficient documentation

## 2016-01-28 DIAGNOSIS — Z79899 Other long term (current) drug therapy: Secondary | ICD-10-CM | POA: Diagnosis not present

## 2016-01-28 DIAGNOSIS — Z23 Encounter for immunization: Secondary | ICD-10-CM | POA: Diagnosis not present

## 2016-01-28 DIAGNOSIS — Y929 Unspecified place or not applicable: Secondary | ICD-10-CM | POA: Diagnosis not present

## 2016-01-28 DIAGNOSIS — Z7982 Long term (current) use of aspirin: Secondary | ICD-10-CM | POA: Diagnosis not present

## 2016-01-28 DIAGNOSIS — W228XXA Striking against or struck by other objects, initial encounter: Secondary | ICD-10-CM | POA: Insufficient documentation

## 2016-01-28 DIAGNOSIS — T148XXA Other injury of unspecified body region, initial encounter: Secondary | ICD-10-CM

## 2016-01-28 DIAGNOSIS — S70312A Abrasion, left thigh, initial encounter: Secondary | ICD-10-CM | POA: Insufficient documentation

## 2016-01-28 MED ORDER — NAPROXEN 250 MG PO TABS: 250.0000 mg | ORAL_TABLET | Freq: Two times a day (BID) | ORAL | 0 refills | Status: DC

## 2016-01-28 MED ORDER — TETANUS-DIPHTH-ACELL PERTUSSIS 5-2.5-18.5 LF-MCG/0.5 IM SUSP
0.5000 mL | Freq: Once | INTRAMUSCULAR | Status: AC
  Administered 2016-01-28: 0.5 mL via INTRAMUSCULAR
  Filled 2016-01-28: qty 0.5

## 2016-01-28 MED ORDER — BACITRACIN ZINC 500 UNIT/GM EX OINT: 1.0000 "application " | TOPICAL_OINTMENT | Freq: Two times a day (BID) | CUTANEOUS | 0 refills | Status: DC

## 2016-01-28 NOTE — ED Triage Notes (Signed)
Pt c/o L leg pain worsening yesterday. Pt hit leg on a utility trailer on Monday. Small [place] to L thigh and tenderness to L ankle

## 2016-01-28 NOTE — ED Notes (Signed)
Patient transported to X-ray at this time via ED stretcher.  Pt in no apparent distress at this time.   

## 2016-01-28 NOTE — ED Notes (Signed)
Pt verbalized understanding of DC instructions, verbalized understanding of prescriptions and follow up. Pt dressed and ambulated and appeared steady with his cane.

## 2016-01-28 NOTE — Progress Notes (Signed)
Orthopedic Tech Progress Note Patient Details:  Jason SoxMichael Hamilton August 26, 1955 [id number]  Ortho Devices Type of Ortho Device: ASO Ortho Device/Splint Location: Lt ankle Ortho Device/Splint Interventions: Application, Minda MeoOrdered   [name] [name] 01/28/2016, 8:11 AM

## 2016-01-28 NOTE — ED Notes (Signed)
Pt returned from radiology at this time via ED stretcher. Pt in no apparent distress at this time.   

## 2016-01-28 NOTE — ED Provider Notes (Signed)
[facility]-EMERGENCY DEPT Provider Note   CSN: [id number] Arrival date & time: 01/28/16  7425     History   Chief Complaint Chief Complaint  Patient presents with  . Leg Pain  . Ankle Pain    HPI Jason Hamilton is a 60 y.o. male.  Jason Hamilton is a 60 y.o. Male who presents to the ED complaining of atraumatic left ankle pain for two days and an abrasion to his left upper thigh.  He reports yesterday he hit his left thigh against a utility trailer and has no abrasion there. He also complains of left ankle pain since yesterday. He denies known injury or trauma to his left ankle. He reports pain worse to the medial aspect worse with ambulation. He is taken to aspirin with little relief today. He is unsure when his last tetanus shot was. He denies fevers, numbness, tingling, weakness, known trauma, or other injury.    The history is provided by the patient. No language interpreter was used.  Leg Pain   Pertinent negatives include no numbness.  Ankle Pain   Pertinent negatives include no numbness.    Past Medical History:  Diagnosis Date  . Hyperlipidemia     Patient Active Problem List   Diagnosis Date Noted  . GERD 09/25/2008  . 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 GASTRIC ULCER, HX OF 09/25/2008  . DEPRESSION 08/25/2008    History reviewed. No pertinent surgical history.     Home Medications    Prior to Admission medications   Medication Sig Start Date End Date Taking? Authorizing Provider  acetaminophen (TYLENOL 8 HOUR) 650 MG CR tablet Take 650 mg by mouth every 8 (eight) hours as needed for pain.    Yes Historical Provider, MD  allopurinol (ZYLOPRIM) 100 MG tablet Take 100 mg by mouth daily. 05/04/15  Yes Historical Provider, MD  aspirin 325 MG tablet Take 650 mg by mouth every 4 (four) hours as needed for mild pain.   Yes Historical Provider, MD  atorvastatin (LIPITOR) 10 MG tablet Take 10 mg by mouth daily.   Yes Historical Provider, MD  cetirizine (ZYRTEC)  10 MG tablet Take 10 mg by mouth daily. 08/22/14  Yes Historical Provider, MD  meloxicam (MOBIC) 15 MG tablet TAKE 1 TABLET EVERY DAY WITH FOOD 03/23/15  Yes Historical Provider, MD  omeprazole (PRILOSEC) 20 MG capsule Take 20 mg by mouth daily. 03/23/15  Yes Historical Provider, MD  VERAMYST 27.5 MCG/SPRAY nasal spray USE 2 SPRAYS EACH NOSTRIL ONCE DAILY AS NEEDED FOR ALLERGIES 03/23/15  Yes Historical Provider, MD  bacitracin ointment Apply 1 application topically 2 (two) times daily. 01/28/16   Everlene Farrier, PA-C  naproxen (NAPROSYN) 250 MG tablet Take 1 tablet (250 mg total) by mouth 2 (two) times daily with a meal. 01/28/16   Everlene Farrier, PA-C    Family History No family history on file.  Social History Social History  Substance Use Topics  . Smoking status: Never Smoker  . Smokeless tobacco: Never Used  . Alcohol use No     Allergies   Review of patient's allergies indicates no known allergies.   Review of Systems Review of Systems  Constitutional: Negative for fever.  Musculoskeletal: Positive for arthralgias.  Skin: Positive for wound. Negative for rash.  Neurological: Negative for weakness and numbness.     Physical Exam Updated Vital Signs BP 132/86 (BP Location: Right Arm)   Pulse (!) 59   Temp 97.8 F (36.6 C) (Oral)   Resp 18  SpO2 98%   Physical Exam  Constitutional: He appears well-developed and well-nourished. No distress.  HENT:  Head: Normocephalic and atraumatic.  Eyes: Right eye exhibits no discharge. Left eye exhibits no discharge.  Cardiovascular: Normal rate, regular rhythm and intact distal pulses.   Bilateral dorsalis pedis and posterior tibialis pulses are intact.  Pulmonary/Chest: Effort normal. No respiratory distress.  Musculoskeletal: Normal range of motion. He exhibits tenderness.  Mild tenderness and edema to his left ankle. No ankle instability. Good strength with plantar and dorsiflexion. No erythema or warmth.  Small abrasion  to left upper thigh, bleeding controlled. No erythema or discharge.   Neurological: He is alert. Coordination normal.  Sensation is intact to his bilateral LE.   Skin: Skin is warm and dry. Capillary refill takes less than 2 seconds. No rash noted. He is not diaphoretic. No erythema. No pallor.  Psychiatric: He has a normal mood and affect. His behavior is normal.  Nursing note and vitals reviewed.    ED Treatments / Results  Labs (all labs ordered are listed, but only abnormal results are displayed) Labs Reviewed - No data to display  EKG  EKG Interpretation None       Radiology Dg Ankle Complete Left  Result Date: 01/28/2016 CLINICAL DATA:  Pain and swelling 2 days.  No injury EXAM: LEFT ANKLE COMPLETE - 3+ VIEW COMPARISON:  06/02/2015 FINDINGS: There is no evidence of fracture, dislocation, or joint effusion. There is no evidence of arthropathy or other focal bone abnormality. Soft tissues are unremarkable. IMPRESSION: Negative. Electronically Signed   By: Marlan Palauharles  Clark M.D.   On: 01/28/2016 07:11    Procedures Procedures (including critical care time)  Medications Ordered in ED Medications  Tdap (BOOSTRIX) injection 0.5 mL (0.5 mLs Intramuscular Given 01/28/16 0701)     Initial Impression / Assessment and Plan / ED Course  I have reviewed the triage vital signs and the nursing notes.  Pertinent labs & imaging results that were available during my care of the patient were reviewed by me and considered in my medical decision making (see chart for details).  Clinical Course   Patient presented to the emergency department complaining of atraumatic left ankle pain and abrasion to his left upper thigh. On exam patient is afebrile nontoxic appearing. Abrasion bleeding is controlled. No sign of infection. Tdap updated in ED today. He has tenderness to the medial aspect of his left ankle. No erythema or warmth. Good range of motion. He is neurovascular intact. X-rays  unremarkable. Will provide with an ASO ankle lace up splint and have him follow-up with his primary care doctor and Ortho if his pain persists. Naproxen and ice for pain control. I advised the patient to follow-up with their primary care provider this week. I advised the patient to return to the emergency department with new or worsening symptoms or new concerns. The patient verbalized understanding and agreement with plan.      Final Clinical Impressions(s) / ED Diagnoses   Final diagnoses:  Left ankle pain  Abrasion    New Prescriptions New Prescriptions   BACITRACIN OINTMENT    Apply 1 application topically 2 (two) times daily.   NAPROXEN (NAPROSYN) 250 MG TABLET    Take 1 tablet (250 mg total) by mouth 2 (two) times daily with a meal.     Everlene FarrierWilliam Naaman Curro, PA-C 01/28/16 0740    Gilda Creasehristopher J Pollina, MD 01/28/16 [phone]

## 2016-10-10 ENCOUNTER — Encounter (HOSPITAL_COMMUNITY): Payer: Self-pay | Admitting: Emergency Medicine

## 2016-10-10 ENCOUNTER — Ambulatory Visit (HOSPITAL_COMMUNITY)
Admission: EM | Admit: 2016-10-10 | Discharge: 2016-10-10 | Disposition: A | Discharge status: Home / Self Care | Payer: Medicaid Other | Attending: Internal Medicine | Admitting: Internal Medicine

## 2016-10-10 DIAGNOSIS — W57XXXA Bitten or stung by nonvenomous insect and other nonvenomous arthropods, initial encounter: Secondary | ICD-10-CM | POA: Diagnosis not present

## 2016-10-10 DIAGNOSIS — L299 Pruritus, unspecified: Secondary | ICD-10-CM

## 2016-10-10 MED ORDER — TRIAMCINOLONE ACETONIDE 0.1 % EX CREA: 1.0000 "application " | TOPICAL_CREAM | Freq: Two times a day (BID) | CUTANEOUS | 0 refills | Status: DC

## 2016-10-10 NOTE — Discharge Instructions (Signed)
Based on your history and description of the tick, along with exam of the area, the likelihood of contracting a disease is low. If you develop fever, muscle aches, body aches, or a rash, return to clinic as needed. For itching I prescribed Triamcinolone cream, apply to the affected area twice daily.

## 2016-10-10 NOTE — ED Provider Notes (Signed)
CSN: [id number]     Arrival date & time 10/10/16  1001 History   First MD Initiated Contact with Patient 10/10/16 1031     Chief Complaint  Patient presents with  . Insect Bite   (Consider location/radiation/quality/duration/timing/severity/associated sxs/prior Treatment) 61 year old male presents to clinic for evaluation following a tick bite. States he found the tick probably was taking a shower Sunday night. States that the tick was small, flat, and was easily removed. Previous day reports working in the yard, around tall grass. He has had no fever, chills, muscle aches, swollen lymph nodes, nausea, or other symptoms. States only past history is high cholesterol. Does have some itching in the general area, but he can feel no bumps, or other complaints.   The history is provided by the patient.    Past Medical History:  Diagnosis Date  . Hyperlipidemia    History reviewed. No pertinent surgical history. History reviewed. No pertinent family history. Social History  Substance Use Topics  . Smoking status: Never Smoker  . Smokeless tobacco: Never Used  . Alcohol use No    Review of Systems  Constitutional: Negative.   HENT: Negative.   Respiratory: Negative.   Cardiovascular: Negative.   Musculoskeletal: Negative.   Skin: Negative.   Neurological: Negative.     Allergies  Patient has no known allergies.  Home Medications   Prior to Admission medications   Medication Sig Start Date End Date Taking? Authorizing Provider  allopurinol (ZYLOPRIM) 100 MG tablet Take 100 mg by mouth daily. 05/04/15  Yes [provider]  atorvastatin (LIPITOR) 10 MG tablet Take 10 mg by mouth daily.   Yes [provider]  triamcinolone cream (KENALOG) 0.1 % Apply 1 application topically 2 (two) times daily. 10/10/16   Dorena BodoKennard, Liv Rallis, NP   Meds Ordered and Administered this Visit  Medications - No data to display  BP (!) 119/58 (BP Location: Left Arm)   Pulse 71   Temp  98.5 F (36.9 C) (Oral)   Resp 16   SpO2 99%  No data found.   Physical Exam  Constitutional: He is oriented to person, place, and time. He appears well-developed and well-nourished. No distress.  HENT:  Head: Normocephalic and atraumatic.  Right Ear: External ear normal.  Left Ear: External ear normal.  Eyes: Conjunctivae are normal.  Neck: Normal range of motion. Neck supple.  Cardiovascular: Normal rate and regular rhythm.   Pulmonary/Chest: Effort normal and breath sounds normal.  Neurological: He is alert and oriented to person, place, and time.  Skin: Skin is warm, dry and intact. Capillary refill takes less than 2 seconds. He is not diaphoretic.     Psychiatric: He has a normal mood and affect. His behavior is normal.  Nursing note and vitals reviewed.   Urgent Care Course     Procedures (including critical care time)  Labs Review Labs Reviewed - No data to display  Imaging Review No results found.         MDM   1. Tick bite, initial encounter    Provided counseling with regard to tick bite treatment, along with counseling regarding why this most likely was not needed at this visit. Given prescription for Kenalog cream, for itching, advised to return to clinic if he became febrile, had muscle aches, bodyaches, swollen lymph nodes, or other symptoms.    Dorena BodoKennard, Frisco Cordts, NP 10/10/16 1116

## 2016-10-10 NOTE — ED Triage Notes (Signed)
The patient presented to the [facility] with a complaint of a tick bite that occurred yesterday. The patient stated that he removed the tick at home.

## 2018-07-12 ENCOUNTER — Emergency Department (HOSPITAL_COMMUNITY)
Admission: EM | Admit: 2018-07-12 | Discharge: 2018-07-12 | Disposition: A | Discharge status: Home / Self Care | Payer: Medicaid Other | Attending: Emergency Medicine | Admitting: Emergency Medicine

## 2018-07-12 ENCOUNTER — Encounter (HOSPITAL_COMMUNITY): Payer: Self-pay

## 2018-07-12 DIAGNOSIS — M25521 Pain in right elbow: Secondary | ICD-10-CM | POA: Diagnosis present

## 2018-07-12 DIAGNOSIS — M109 Gout, unspecified: Secondary | ICD-10-CM | POA: Insufficient documentation

## 2018-07-12 DIAGNOSIS — Z79899 Other long term (current) drug therapy: Secondary | ICD-10-CM | POA: Diagnosis not present

## 2018-07-12 HISTORY — DX: Gout, unspecified: M10.9

## 2018-07-12 MED ORDER — OXYCODONE-ACETAMINOPHEN 5-325 MG PO TABS: 1.0000 | ORAL_TABLET | Freq: Four times a day (QID) | ORAL | 0 refills | Status: DC | PRN

## 2018-07-12 MED ORDER — PREDNISONE 50 MG PO TABS: 50.0000 mg | ORAL_TABLET | Freq: Every day | ORAL | 0 refills | Status: DC

## 2018-07-12 NOTE — ED Triage Notes (Signed)
Pt complains of right elbow pain that began last night, denies injury. Hx of gout. VSS. PMS intact.

## 2018-07-12 NOTE — ED Provider Notes (Signed)
[facility] EMERGENCY DEPARTMENT Provider Note   CSN: [id number] Arrival date & time: 07/12/18  0705     History   Chief Complaint Chief Complaint  Patient presents with  . Arm Pain    HPI Jeramie Sugden is a 63 y.o. male.  HPI   63 year old male with a past medical history of gout presents today with complaints of right elbow pain.  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  He was working on a Oceanographer.  He notes last night he developed an ache in his right elbow worse at night.  He notes pain with range of motion both pronation supination and flexion of the elbow.  Patient denies any significant swelling redness or warmth.  Patient notes that this feels identical to previous episodes of gout that he generally gets in his toes, but has not had a gout in this elbow previously.  He denies any fever.  No other known trauma.  No medications prior to arrival other than his daily atorvastatin.  Notes that he does not drink alcohol, has not had any recent meat intake.  No pain medication prior to arrival.  Past Medical History:  Diagnosis Date  . Gout   . Hyperlipidemia     Patient Active Problem List   Diagnosis Date Noted  . GERD 09/25/2008  . 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 GASTRIC ULCER, HX OF 09/25/2008  . DEPRESSION 08/25/2008    History reviewed. No pertinent surgical history.      Home Medications    Prior to Admission medications   Medication Sig Start Date End Date Taking? Authorizing Provider  allopurinol (ZYLOPRIM) 100 MG tablet Take 100 mg by mouth daily. 05/04/15   [provider]  atorvastatin (LIPITOR) 10 MG tablet Take 10 mg by mouth daily.    [provider]  oxyCODONE-acetaminophen (PERCOCET/ROXICET) 5-325 MG tablet Take 1 tablet by mouth every 6 (six) hours as needed for severe pain. 07/12/18   Orestes Geiman, Tinnie Gens, PA-C  predniSONE (DELTASONE) 50 MG tablet Take 1 tablet (50  mg total) by mouth daily. 07/12/18   [place] Stell, Tinnie Gens, PA-C  triamcinolone cream (KENALOG) 0.1 % Apply 1 application topically 2 (two) times daily. 10/10/16   Dorena Bodo, NP    Family History History reviewed. No pertinent family history.  Social History Social History   Tobacco Use  . Smoking status: Never Smoker  . Smokeless tobacco: Never Used  Substance Use Topics  . Alcohol use: No  . Drug use: No     Allergies   Patient has no known allergies.   Review of Systems Review of Systems  All other systems reviewed and are negative.    Physical Exam Updated Vital Signs BP 140/87 (BP Location: Left Arm)   Pulse 87   Temp 98 F (36.7 C) (Oral)   Resp 16   SpO2 100%   Physical Exam Vitals signs and nursing note reviewed.  Constitutional:      Appearance: He is well-developed.  HENT:     Head: Normocephalic and atraumatic.  Eyes:     General: No scleral icterus.       Right eye: No discharge.        Left eye: No discharge.     Conjunctiva/sclera: Conjunctivae normal.     Pupils: Pupils are equal, round, and reactive to light.  Neck:     Musculoskeletal: Normal range of motion.     Vascular: No JVD.  Trachea: No tracheal deviation.  Pulmonary:     Effort: Pulmonary effort is normal.     Breath sounds: No stridor.  Musculoskeletal:     Comments: Right elbow atraumatic without swelling warmth or redness.  Pain with pronation supination and flexion at the elbow, patient is able to flex elbow to 90 degrees with significant discomfort-no open wounds noted  Neurological:     Mental Status: He is alert and oriented to person, place, and time.     Coordination: Coordination normal.  Psychiatric:        Behavior: Behavior normal.        Thought Content: Thought content normal.        Judgment: Judgment normal.      ED Treatments / Results  Labs (all labs ordered are listed, but only abnormal results are displayed) Labs Reviewed - No data to  display  EKG None  Radiology No results found.  Procedures Procedures (including critical care time)  Medications Ordered in ED Medications - No data to display   Initial Impression / Assessment and Plan / ED Course  I have reviewed the triage vital signs and the nursing notes.  Pertinent labs & imaging results that were available during my care of the patient were reviewed by me and considered in my medical decision making (see chart for details).    63 year old male presents today with complaints of right elbow pain.  Differential includes gout, overuse injury, and much less likely septic arthritis.  Patient is afebrile he has no redness or warmth to the joint.  I have very low suspicion for septic arthritis at that time, he does note this pain feels identical to previous gout flares.  I will treat him with prednisone and pain medicine I discussed the need to return immediately if he develops any new or worsening signs or symptoms or symptoms do not improve in the next 36 hours.  Patient verbalized understanding and agreement to today's plan had no further questions or concerns.  Final Clinical Impressions(s) / ED Diagnoses   Final diagnoses:  Acute gout of right elbow, unspecified cause    ED Discharge Orders         Ordered    oxyCODONE-acetaminophen (PERCOCET/ROXICET) 5-325 MG tablet  Every 6 hours PRN,   Status:  Discontinued     07/12/18 0840    predniSONE (DELTASONE) 50 MG tablet  Daily     07/12/18 0840    oxyCODONE-acetaminophen (PERCOCET/ROXICET) 5-325 MG tablet  Every 6 hours PRN     07/12/18 0841           Eyvonne MechanicHedges, Silvester Reierson, PA-C 07/12/18 0842    Terrilee FilesButler, Javonnie C, MD 07/12/18 [phone]

## 2018-07-12 NOTE — Discharge Instructions (Signed)
Please read attached information. If you experience any new or worsening signs or symptoms please return to the emergency room for evaluation. Please follow-up with your primary care provider or specialist as discussed. Please use medication prescribed only as directed and discontinue taking if you have any concerning signs or symptoms.   °

## 2019-08-12 ENCOUNTER — Ambulatory Visit (INDEPENDENT_AMBULATORY_CARE_PROVIDER_SITE_OTHER): Payer: Medicaid Other | Admitting: Orthopaedic Surgery

## 2019-08-12 ENCOUNTER — Other Ambulatory Visit: Payer: Self-pay

## 2019-08-12 ENCOUNTER — Ambulatory Visit (INDEPENDENT_AMBULATORY_CARE_PROVIDER_SITE_OTHER): Payer: Medicaid Other

## 2019-08-12 VITALS — Ht 67.5 in | Wt 260.0 lb

## 2019-08-12 DIAGNOSIS — M1711 Unilateral primary osteoarthritis, right knee: Secondary | ICD-10-CM | POA: Diagnosis not present

## 2019-08-12 DIAGNOSIS — M25561 Pain in right knee: Secondary | ICD-10-CM

## 2019-08-12 DIAGNOSIS — G8929 Other chronic pain: Secondary | ICD-10-CM | POA: Diagnosis not present

## 2019-08-12 NOTE — Progress Notes (Signed)
Office Visit Note   Patient: Jason Hamilton           Date of Birth: [birth year]           MRN: [id number] Visit Date: 08/12/2019              Requested by: Fleet Contras, MD [address] [place],  [place] [address] PCP: Fleet Contras, MD   Assessment & Plan: Visit Diagnoses:  1. Chronic pain of right knee   2. Unilateral primary osteoarthritis, right knee     Plan: Based on a combination of the patient's clinical exam, signs and symptoms, x-ray findings and the failure conservative treatment for over a year, we are recommending a knee replacement for his right knee.  I went over his x-rays with him in detail.  I showed him a knee replacement model and explained in detail what the surgery involves.  We talked about the risk and benefits of surgery.  We discussed what to expect with his interoperative and postoperative course.  I feel comfortable with proceeding with surgery at this point given a combination of the above.  All question concerns were answered and addressed.  We will work on getting this scheduled.  Follow-Up Instructions: Return for 2 weeks post-op.   Orders:  Orders Placed This Encounter  Procedures  . XR Knee 1-2 Views Right   No orders of the defined types were placed in this encounter.     Procedures: No procedures performed   Clinical Data: No additional findings.   Subjective: Chief Complaint  Patient presents with  . Right Knee - Pain  The patient is a very pleasant 64 year old gentleman who comes in with debilitating right knee pain.  He is referred from his primary care physician Dr. Concepcion Elk with known end-stage arthritis of his right knee.  He has tried and failed conservative treatment for multiple years now.  He has had multiple steroid injections as well.  None of these things have helped.  He does have family members that have also had knee replacement surgery.  His BMI is 40.  He is worked on weight loss as well as activity modification  and has taken anti-inflammatory medications.  He is worked on walking exercises and exercise program with therapy.  At this point his right knee pain is definitely affecting his mobility, his quality of life and his actives daily living.  His pain is 10 out of 10 daily.  He is not a diabetic  HPI  Review of Systems He currently denies any headache, chest pain, shortness of breath, fever, chills, nausea, vomiting  Objective: Vital Signs: Ht 5' 7.5" (1.715 m)   Wt 260 lb (117.9 kg)   BMI 40.12 kg/m   Physical Exam He is alert and orient x3 and in no acute distress Ortho Exam Examination of his right knee shows significant varus malalignment.  This is slightly correctable.  He has a slight flexion contracture of the knee as well.  There is significant medial joint line tenderness.  His varus malalignment is correctable.  The knee is ligamentously stable with good range of motion but is painful throughout his arc range of motion and is severe patellofemoral crepitation. Specialty Comments:  No specialty comments available.  Imaging: XR Knee 1-2 Views Right  Result Date: 08/12/2019 2 views of the right knee show severe end-stage arthritis of the right knee.  There is varus malalignment.  There is complete loss of the medial joint space.  There are large periarticular  osteophytes in all 3 compartments with severe patellofemoral disease as well.  There is also sclerotic changes in all 3 compartments.    PMFS History: Patient Active Problem List   Diagnosis Date Noted  . Unilateral primary osteoarthritis, right knee 08/12/2019  . GERD 09/25/2008  . 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 GASTRIC ULCER, HX OF 09/25/2008  . DEPRESSION 08/25/2008   Past Medical History:  Diagnosis Date  . Gout   . Hyperlipidemia     No family history on file.  No past surgical history on file. Social History   Occupational History  . Not on file  Tobacco Use  . Smoking status: Never Smoker  .  Smokeless tobacco: Never Used  Substance and Sexual Activity  . Alcohol use: No  . Drug use: No  . Sexual activity: Not on file

## 2019-08-28 ENCOUNTER — Other Ambulatory Visit: Payer: Self-pay | Admitting: Family

## 2019-08-28 NOTE — Patient Instructions (Addendum)
DUE TO COVID-19 ONLY ONE VISITOR IS ALLOWED TO COME WITH YOU AND STAY IN THE WAITING ROOM ONLY DURING PRE OP AND PROCEDURE DAY OF SURGERY. TWO  VISITOR MAY VISIT WITH YOU AFTER SURGERY IN YOUR PRIVATE ROOM DURING VISITING HOURS ONLY!  10a-8p  YOU NEED TO HAVE A COVID 19 TEST ON__4-6-21_____ @___DONE____ , THIS TEST MUST BE DONE BEFORE SURGERY, COME  [address], [place] , [address].  ([place]) ONCE YOUR COVID TEST IS COMPLETED, PLEASE BEGIN THE QUARANTINE INSTRUCTIONS AS OUTLINED IN YOUR HANDOUT.                Jason Hamilton  08/28/2019   Your procedure is scheduled on: 09-06-19   Report to [facility] Main  Entrance   Report to admitting at       [place] AM     Call this number if you have problems the morning of surgery [phone]    Remember: NO SOLID FOOD AFTER MIDNIGHT THE NIGHT PRIOR TO SURGERY. NOTHING BY MOUTH EXCEPT CLEAR LIQUIDS UNTIL    0645 am  . PLEASE FINISH ENSURE DRINK PER SURGEON ORDER  WHICH NEEDS TO BE COMPLETED AT      0645 am then nothing by mouth .   BRUSH YOUR TEETH MORNING OF SURGERY AND RINSE YOUR MOUTH OUT, NO CHEWING GUM CANDY OR MINTS.     Take these medicines the morning of surgery with A SIP OF WATER: allopurinol                                You may not have any metal on your body including hair pins and              piercings  Do not wear jewelry, , lotions, powders or perfumes, deodorant                      Men may shave face and neck.   Do not bring valuables to the hospital. Jason Hamilton.  Contacts, dentures or bridgework may not be worn into surgery.  Leave suitcase in the car. After surgery it may be brought to your room.     Patients discharged the day of surgery will not be allowed to drive home. IF YOU ARE HAVING SURGERY AND GOING HOME THE SAME DAY, YOU MUST HAVE AN ADULT TO DRIVE YOU HOME AND BE WITH YOU FOR 24 HOURS. YOU MAY GO HOME BY TAXI OR UBER OR  ORTHERWISE, BUT AN ADULT MUST ACCOMPANY YOU HOME AND STAY WITH YOU FOR 24 HOURS.  Name and phone number of your driver:  Special Instructions: N/A              Please read over the following fact sheets you were given: _____________________________________________________________________          [facility] - Preparing for Surgery Before surgery, you can play an important role.  Because skin is not sterile, your skin needs to be as free of germs as possible.  You can reduce the number of germs on your skin by washing with CHG (chlorahexidine gluconate) soap before surgery.  CHG is an antiseptic cleaner which kills germs and bonds with the skin to continue killing germs even after washing. Please DO NOT use if you have an allergy to CHG  or antibacterial soaps.  If your skin becomes reddened/irritated stop using the CHG and inform your nurse when you arrive at Short Stay. Do not shave (including legs and underarms) for at least 48 hours prior to the first CHG shower.  You may shave your face/neck. Please follow these instructions carefully:  1.  Shower with CHG Soap the night before surgery and the  morning of Surgery.  2.  If you choose to wash your hair, wash your hair first as usual with your  normal  shampoo.  3.  After you shampoo, rinse your hair and body thoroughly to remove the  shampoo.                           4.  Use CHG as you would any other liquid soap.  You can apply chg directly  to the skin and wash                       Gently with a scrungie or clean washcloth.  5.  Apply the CHG Soap to your body ONLY FROM THE NECK DOWN.   Do not use on face/ open                           Wound or open sores. Avoid contact with eyes, ears mouth and genitals (private parts).                       Wash face,  Genitals (private parts) with your normal soap.             6.  Wash thoroughly, paying special attention to the area where your surgery  will be performed.  7.  Thoroughly rinse your  body with warm water from the neck down.  8.  DO NOT shower/wash with your normal soap after using and rinsing off  the CHG Soap.                9.  Pat yourself dry with a clean towel.            10.  Wear clean pajamas.            11.  Place clean sheets on your bed the night of your first shower and do not  sleep with pets. Day of Surgery : Do not apply any lotions/deodorants the morning of surgery.  Please wear clean clothes to the hospital/surgery center.  FAILURE TO FOLLOW THESE INSTRUCTIONS MAY RESULT IN THE CANCELLATION OF YOUR SURGERY PATIENT SIGNATURE_________________________________  NURSE SIGNATURE__________________________________  ________________________________________________________________________   Jason Hamilton  An incentive spirometer is a tool that can help keep your lungs clear and active. This tool measures how well you are filling your lungs with each breath. Taking long deep breaths may help reverse or decrease the chance of developing breathing (pulmonary) problems (especially infection) following:  A long period of time when you are unable to move or be active. BEFORE THE PROCEDURE   If the spirometer includes an indicator to show your best effort, your nurse or respiratory therapist will set it to a desired goal.  If possible, sit up straight or lean slightly forward. Try not to slouch.  Hold the incentive spirometer in an upright position. INSTRUCTIONS FOR USE  1. Sit on the edge of your bed if possible, or sit up as far as you can in  bed or on a chair. 2. Hold the incentive spirometer in an upright position. 3. Breathe out normally. 4. Place the mouthpiece in your mouth and seal your lips tightly around it. 5. Breathe in slowly and as deeply as possible, raising the piston or the ball toward the top of the column. 6. Hold your breath for 3-5 seconds or for as long as possible. Allow the piston or ball to fall to the bottom of the  column. 7. Remove the mouthpiece from your mouth and breathe out normally. 8. Rest for a few seconds and repeat Steps 1 through 7 at least 10 times every 1-2 hours when you are awake. Take your time and take a few normal breaths between deep breaths. 9. The spirometer may include an indicator to show your best effort. Use the indicator as a goal to work toward during each repetition. 10. After each set of 10 deep breaths, practice coughing to be sure your lungs are clear. If you have an incision (the cut made at the time of surgery), support your incision when coughing by placing a pillow or rolled up towels firmly against it. Once you are able to get out of bed, walk around indoors and cough well. You may stop using the incentive spirometer when instructed by your caregiver.  RISKS AND COMPLICATIONS  Take your time so you do not get dizzy or light-headed.  If you are in pain, you may need to take or ask for pain medication before doing incentive spirometry. It is harder to take a deep breath if you are having pain. AFTER USE  Rest and breathe slowly and easily.  It can be helpful to keep track of a log of your progress. Your caregiver can provide you with a simple table to help with this. If you are using the spirometer at home, follow these instructions: SEEK MEDICAL CARE IF:   You are having difficultly using the spirometer.  You have trouble using the spirometer as often as instructed.  Your pain medication is not giving enough relief while using the spirometer.  You develop fever of 100.5 F (38.1 C) or higher. SEEK IMMEDIATE MEDICAL CARE IF:   You cough up bloody sputum that had not been present before.  You develop fever of 102 F (38.9 C) or greater.  You develop worsening pain at or near the incision site. MAKE SURE YOU:   Understand these instructions.  Will watch your condition.  Will get help right away if you are not doing well or get worse. Document Released:  09/26/2006 Document Revised: 08/08/2011 Document Reviewed: 11/27/2006 [facility] Patient Information 2014 [place], [place].   ________________________________________________________________________

## 2019-08-28 NOTE — Progress Notes (Addendum)
PCP -  Cardiologist -   Chest x-ray -  EKG -  Stress Test -  ECHO -  Cardiac Cath -   Sleep Study -  CPAP -   Fasting Blood Sugar -  Checks Blood Sugar _____ times a day  Blood Thinner Instructions: Aspirin Instructions: Last Dose:  Anesthesia review:   Patient denies shortness of breath, fever, cough and chest pain at PAT appointment   NONE   Patient verbalized understanding of instructions that were given to them at the PAT appointment. Patient was also instructed that they will need to review over the PAT instructions again at home before surgery.

## 2019-08-29 ENCOUNTER — Other Ambulatory Visit: Payer: Self-pay

## 2019-09-03 ENCOUNTER — Encounter (HOSPITAL_COMMUNITY): Payer: Self-pay

## 2019-09-03 ENCOUNTER — Encounter (HOSPITAL_COMMUNITY)
Admission: RE | Admit: 2019-09-03 | Discharge: 2019-09-03 | Disposition: A | Discharge status: Home / Self Care | Payer: Medicaid Other | Source: Ambulatory Visit | Attending: Orthopaedic Surgery | Admitting: Orthopaedic Surgery

## 2019-09-03 ENCOUNTER — Other Ambulatory Visit: Payer: Self-pay

## 2019-09-03 ENCOUNTER — Other Ambulatory Visit (HOSPITAL_COMMUNITY)
Admission: RE | Admit: 2019-09-03 | Discharge: 2019-09-03 | Disposition: A | Discharge status: Home / Self Care | Payer: Medicaid Other | Source: Ambulatory Visit

## 2019-09-03 DIAGNOSIS — Z20822 Contact with and (suspected) exposure to covid-19: Secondary | ICD-10-CM | POA: Diagnosis not present

## 2019-09-03 DIAGNOSIS — Z01812 Encounter for preprocedural laboratory examination: Secondary | ICD-10-CM | POA: Diagnosis present

## 2019-09-03 HISTORY — DX: Inflammatory liver disease, unspecified: K75.9

## 2019-09-03 HISTORY — DX: Unspecified osteoarthritis, unspecified site: M19.90

## 2019-09-03 LAB — CBC
HCT: 41.4 % (ref 39.0–52.0)
Hemoglobin: 13.2 g/dL (ref 13.0–17.0)
MCH: 27.1 pg (ref 26.0–34.0)
MCHC: 31.9 g/dL (ref 30.0–36.0)
MCV: 85 fL (ref 80.0–100.0)
Platelets: 237 10*3/uL (ref 150–400)
RBC: 4.87 MIL/uL (ref 4.22–5.81)
RDW: 13.6 % (ref 11.5–15.5)
WBC: 6 10*3/uL (ref 4.0–10.5)
nRBC: 0 % (ref 0.0–0.2)

## 2019-09-03 LAB — BASIC METABOLIC PANEL
Anion gap: 11 (ref 5–15)
BUN: 23 mg/dL (ref 8–23)
CO2: 27 mmol/L (ref 22–32)
Calcium: 9.3 mg/dL (ref 8.9–10.3)
Chloride: 103 mmol/L (ref 98–111)
Creatinine, Ser: 1.1 mg/dL (ref 0.61–1.24)
GFR calc Af Amer: >60 mL/min (ref >60)
GFR calc non Af Amer: >60 mL/min (ref >60)
Glucose, Bld: 96 mg/dL (ref 70–99)
Potassium: 4.2 mmol/L (ref 3.5–5.1)
Sodium: 141 mmol/L (ref 135–145)

## 2019-09-03 LAB — SURGICAL PCR SCREEN
MRSA, PCR: NEGATIVE
Staphylococcus aureus: NEGATIVE

## 2019-09-03 LAB — SARS CORONAVIRUS 2 (TAT 6-24 HRS): SARS Coronavirus 2: NEGATIVE
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
## 2019-09-06 ENCOUNTER — Ambulatory Visit (HOSPITAL_COMMUNITY): Payer: Medicaid Other | Admitting: Registered Nurse

## 2019-09-06 ENCOUNTER — Encounter (HOSPITAL_COMMUNITY): Payer: Self-pay | Admitting: Orthopaedic Surgery

## 2019-09-06 ENCOUNTER — Observation Stay (HOSPITAL_COMMUNITY)
Admission: RE | Admit: 2019-09-06 | Discharge: 2019-09-07 | Disposition: A | Discharge status: Home / Self Care | Payer: Medicaid Other | Attending: Orthopaedic Surgery | Admitting: Orthopaedic Surgery

## 2019-09-06 ENCOUNTER — Encounter (HOSPITAL_COMMUNITY)
Admission: RE | Disposition: A | Discharge status: Home / Self Care | Payer: Self-pay | Source: Home / Self Care | Attending: Orthopaedic Surgery

## 2019-09-06 ENCOUNTER — Other Ambulatory Visit: Payer: Self-pay

## 2019-09-06 ENCOUNTER — Observation Stay (HOSPITAL_COMMUNITY): Payer: Medicaid Other

## 2019-09-06 DIAGNOSIS — Z7982 Long term (current) use of aspirin: Secondary | ICD-10-CM | POA: Diagnosis not present

## 2019-09-06 DIAGNOSIS — M21161 Varus deformity, not elsewhere classified, right knee: Secondary | ICD-10-CM | POA: Diagnosis not present

## 2019-09-06 DIAGNOSIS — Z6838 Body mass index (BMI) 38.0-38.9, adult: Secondary | ICD-10-CM | POA: Diagnosis not present

## 2019-09-06 DIAGNOSIS — E785 Hyperlipidemia, unspecified: Secondary | ICD-10-CM | POA: Insufficient documentation

## 2019-09-06 DIAGNOSIS — Z791 Long term (current) use of non-steroidal anti-inflammatories (NSAID): Secondary | ICD-10-CM | POA: Insufficient documentation

## 2019-09-06 DIAGNOSIS — F329 Major depressive disorder, single episode, unspecified: Secondary | ICD-10-CM | POA: Insufficient documentation

## 2019-09-06 DIAGNOSIS — Z96651 Presence of right artificial knee joint: Secondary | ICD-10-CM

## 2019-09-06 DIAGNOSIS — Z79899 Other long term (current) drug therapy: Secondary | ICD-10-CM | POA: Diagnosis not present

## 2019-09-06 DIAGNOSIS — M109 Gout, unspecified: Secondary | ICD-10-CM | POA: Insufficient documentation

## 2019-09-06 DIAGNOSIS — E669 Obesity, unspecified: Secondary | ICD-10-CM | POA: Insufficient documentation

## 2019-09-06 DIAGNOSIS — Z7952 Long term (current) use of systemic steroids: Secondary | ICD-10-CM | POA: Diagnosis not present

## 2019-09-06 DIAGNOSIS — K219 Gastro-esophageal reflux disease without esophagitis: Secondary | ICD-10-CM | POA: Insufficient documentation

## 2019-09-06 DIAGNOSIS — M1711 Unilateral primary osteoarthritis, right knee: Secondary | ICD-10-CM | POA: Diagnosis not present

## 2019-09-06 DIAGNOSIS — Z8711 Personal history of peptic ulcer disease: Secondary | ICD-10-CM | POA: Diagnosis not present

## 2019-09-06 HISTORY — PX: TOTAL KNEE ARTHROPLASTY: SHX125

## 2019-09-06 SURGERY — ARTHROPLASTY, KNEE, TOTAL
Anesthesia: Spinal | Site: Knee | Laterality: Right

## 2019-09-06 MED ORDER — SODIUM CHLORIDE 0.9 % IV SOLN: INTRAVENOUS | Status: DC

## 2019-09-06 MED ORDER — EPHEDRINE 5 MG/ML INJ
INTRAVENOUS | Status: AC
  Filled 2019-09-06: qty 10

## 2019-09-06 MED ORDER — MENTHOL 3 MG MT LOZG: 1.0000 | LOZENGE | OROMUCOSAL | Status: DC | PRN

## 2019-09-06 MED ORDER — HYDROMORPHONE HCL 1 MG/ML IJ SOLN: 0.5000 mg | INTRAMUSCULAR | Status: DC | PRN

## 2019-09-06 MED ORDER — PROPOFOL 1000 MG/100ML IV EMUL
INTRAVENOUS | Status: AC
  Filled 2019-09-06: qty 100

## 2019-09-06 MED ORDER — PHENYLEPHRINE 40 MCG/ML (10ML) SYRINGE FOR IV PUSH (FOR BLOOD PRESSURE SUPPORT)
PREFILLED_SYRINGE | INTRAVENOUS | Status: AC
  Filled 2019-09-06: qty 10

## 2019-09-06 MED ORDER — METHOCARBAMOL 500 MG PO TABS
500.0000 mg | ORAL_TABLET | Freq: Four times a day (QID) | ORAL | Status: DC | PRN
  Administered 2019-09-06 – 2019-09-07 (×3): 500 mg via ORAL
  Filled 2019-09-06 (×3): qty 1

## 2019-09-06 MED ORDER — PROPOFOL 10 MG/ML IV BOLUS
INTRAVENOUS | Status: AC
  Filled 2019-09-06: qty 20

## 2019-09-06 MED ORDER — PANTOPRAZOLE SODIUM 40 MG PO TBEC
40.0000 mg | DELAYED_RELEASE_TABLET | Freq: Every day | ORAL | Status: DC
  Administered 2019-09-06 – 2019-09-07 (×2): 40 mg via ORAL
  Filled 2019-09-06 (×2): qty 1

## 2019-09-06 MED ORDER — GABAPENTIN 100 MG PO CAPS
100.0000 mg | ORAL_CAPSULE | Freq: Three times a day (TID) | ORAL | Status: DC
  Administered 2019-09-06 – 2019-09-07 (×3): 100 mg via ORAL
  Filled 2019-09-06 (×3): qty 1

## 2019-09-06 MED ORDER — ONDANSETRON HCL 4 MG/2ML IJ SOLN: 4.0000 mg | Freq: Four times a day (QID) | INTRAMUSCULAR | Status: DC | PRN

## 2019-09-06 MED ORDER — ASPIRIN EC 325 MG PO TBEC
325.0000 mg | DELAYED_RELEASE_TABLET | Freq: Two times a day (BID) | ORAL | Status: DC
  Administered 2019-09-06 – 2019-09-07 (×2): 325 mg via ORAL
  Filled 2019-09-06 (×2): qty 1

## 2019-09-06 MED ORDER — PHENOL 1.4 % MT LIQD: 1.0000 | OROMUCOSAL | Status: DC | PRN

## 2019-09-06 MED ORDER — OXYCODONE HCL 5 MG PO TABS
5.0000 mg | ORAL_TABLET | ORAL | Status: DC | PRN
  Administered 2019-09-06: 10 mg via ORAL
  Administered 2019-09-06 (×2): 5 mg via ORAL
  Administered 2019-09-07: 10 mg via ORAL
  Filled 2019-09-06: qty 2
  Filled 2019-09-06: qty 1
  Filled 2019-09-06: qty 2
  Filled 2019-09-06: qty 1

## 2019-09-06 MED ORDER — KETOROLAC TROMETHAMINE 15 MG/ML IJ SOLN
7.5000 mg | Freq: Four times a day (QID) | INTRAMUSCULAR | Status: DC
  Administered 2019-09-06 – 2019-09-07 (×3): 7.5 mg via INTRAVENOUS
  Filled 2019-09-06 (×3): qty 1

## 2019-09-06 MED ORDER — BUPIVACAINE HCL (PF) 0.5 % IJ SOLN
INTRAMUSCULAR | Status: DC | PRN
  Administered 2019-09-06: 25 mL

## 2019-09-06 MED ORDER — MIDAZOLAM HCL 2 MG/2ML IJ SOLN
1.0000 mg | INTRAMUSCULAR | Status: DC
  Administered 2019-09-06: 1 mg via INTRAVENOUS
  Filled 2019-09-06: qty 2

## 2019-09-06 MED ORDER — 0.9 % SODIUM CHLORIDE (POUR BTL) OPTIME
TOPICAL | Status: DC | PRN
  Administered 2019-09-06: 1000 mL

## 2019-09-06 MED ORDER — OXYCODONE HCL 5 MG PO TABS
10.0000 mg | ORAL_TABLET | ORAL | Status: DC | PRN
  Administered 2019-09-07: 10 mg via ORAL
  Filled 2019-09-06: qty 2

## 2019-09-06 MED ORDER — DEXAMETHASONE SODIUM PHOSPHATE 10 MG/ML IJ SOLN
INTRAMUSCULAR | Status: DC | PRN
  Administered 2019-09-06: 8 mg via INTRAVENOUS

## 2019-09-06 MED ORDER — BUPIVACAINE HCL (PF) 0.25 % IJ SOLN
INTRAMUSCULAR | Status: AC
  Filled 2019-09-06: qty 30

## 2019-09-06 MED ORDER — POLYETHYLENE GLYCOL 3350 17 G PO PACK: 17.0000 g | PACK | Freq: Every day | ORAL | Status: DC | PRN

## 2019-09-06 MED ORDER — ONDANSETRON HCL 4 MG/2ML IJ SOLN
INTRAMUSCULAR | Status: DC | PRN
  Administered 2019-09-06: 4 mg via INTRAVENOUS

## 2019-09-06 MED ORDER — EPHEDRINE SULFATE-NACL 50-0.9 MG/10ML-% IV SOSY
PREFILLED_SYRINGE | INTRAVENOUS | Status: DC | PRN
  Administered 2019-09-06: 10 mg via INTRAVENOUS
  Administered 2019-09-06: 5 mg via INTRAVENOUS

## 2019-09-06 MED ORDER — SODIUM CHLORIDE 0.9 % IR SOLN
Status: DC | PRN
  Administered 2019-09-06: 1000 mL

## 2019-09-06 MED ORDER — ATORVASTATIN CALCIUM 10 MG PO TABS
10.0000 mg | ORAL_TABLET | Freq: Every day | ORAL | Status: DC
  Administered 2019-09-06 – 2019-09-07 (×2): 10 mg via ORAL
  Filled 2019-09-06 (×2): qty 1

## 2019-09-06 MED ORDER — DEXAMETHASONE SODIUM PHOSPHATE 10 MG/ML IJ SOLN
INTRAMUSCULAR | Status: AC
  Filled 2019-09-06: qty 1

## 2019-09-06 MED ORDER — ALLOPURINOL 100 MG PO TABS
100.0000 mg | ORAL_TABLET | Freq: Every day | ORAL | Status: DC
  Filled 2019-09-06: qty 1

## 2019-09-06 MED ORDER — CEFAZOLIN SODIUM-DEXTROSE 2-4 GM/100ML-% IV SOLN
2.0000 g | INTRAVENOUS | Status: AC
  Administered 2019-09-06: 2 g via INTRAVENOUS
  Filled 2019-09-06: qty 100

## 2019-09-06 MED ORDER — FLUTICASONE PROPIONATE 50 MCG/ACT NA SUSP
2.0000 | Freq: Every day | NASAL | Status: DC | PRN
  Filled 2019-09-06: qty 16

## 2019-09-06 MED ORDER — LACTATED RINGERS IV SOLN: INTRAVENOUS | Status: DC

## 2019-09-06 MED ORDER — CEFAZOLIN SODIUM-DEXTROSE 1-4 GM/50ML-% IV SOLN
1.0000 g | Freq: Four times a day (QID) | INTRAVENOUS | Status: AC
  Administered 2019-09-06 (×2): 1 g via INTRAVENOUS
  Filled 2019-09-06 (×2): qty 50

## 2019-09-06 MED ORDER — BUPIVACAINE IN DEXTROSE 0.75-8.25 % IT SOLN
INTRATHECAL | Status: DC | PRN
  Administered 2019-09-06: 1.8 mL via INTRATHECAL

## 2019-09-06 MED ORDER — PROPOFOL 10 MG/ML IV BOLUS
INTRAVENOUS | Status: DC | PRN
  Administered 2019-09-06 (×2): 20 mg via INTRAVENOUS
  Administered 2019-09-06: 40 ug/kg/min via INTRAVENOUS

## 2019-09-06 MED ORDER — CLONIDINE HCL (ANALGESIA) 100 MCG/ML EP SOLN
EPIDURAL | Status: DC | PRN
  Administered 2019-09-06: 100 ug

## 2019-09-06 MED ORDER — METOCLOPRAMIDE HCL 5 MG/ML IJ SOLN: 5.0000 mg | Freq: Three times a day (TID) | INTRAMUSCULAR | Status: DC | PRN

## 2019-09-06 MED ORDER — METHOCARBAMOL 500 MG IVPB - SIMPLE MED
500.0000 mg | Freq: Four times a day (QID) | INTRAVENOUS | Status: DC | PRN
  Filled 2019-09-06: qty 50

## 2019-09-06 MED ORDER — PHENYLEPHRINE 40 MCG/ML (10ML) SYRINGE FOR IV PUSH (FOR BLOOD PRESSURE SUPPORT)
PREFILLED_SYRINGE | INTRAVENOUS | Status: DC | PRN
  Administered 2019-09-06 (×4): 80 ug via INTRAVENOUS

## 2019-09-06 MED ORDER — DOCUSATE SODIUM 100 MG PO CAPS
100.0000 mg | ORAL_CAPSULE | Freq: Two times a day (BID) | ORAL | Status: DC
  Administered 2019-09-06 – 2019-09-07 (×2): 100 mg via ORAL
  Filled 2019-09-06 (×2): qty 1

## 2019-09-06 MED ORDER — ONDANSETRON HCL 4 MG/2ML IJ SOLN: 4.0000 mg | Freq: Once | INTRAMUSCULAR | Status: DC | PRN

## 2019-09-06 MED ORDER — DIPHENHYDRAMINE HCL 12.5 MG/5ML PO ELIX: 12.5000 mg | ORAL_SOLUTION | ORAL | Status: DC | PRN

## 2019-09-06 MED ORDER — ONDANSETRON HCL 4 MG/2ML IJ SOLN
INTRAMUSCULAR | Status: AC
  Filled 2019-09-06: qty 2

## 2019-09-06 MED ORDER — HYDROMORPHONE HCL 1 MG/ML IJ SOLN: 0.2500 mg | INTRAMUSCULAR | Status: DC | PRN

## 2019-09-06 MED ORDER — METOCLOPRAMIDE HCL 5 MG PO TABS: 5.0000 mg | ORAL_TABLET | Freq: Three times a day (TID) | ORAL | Status: DC | PRN

## 2019-09-06 MED ORDER — TRANEXAMIC ACID-NACL 1000-0.7 MG/100ML-% IV SOLN
1000.0000 mg | INTRAVENOUS | Status: AC
  Administered 2019-09-06: 1000 mg via INTRAVENOUS
  Filled 2019-09-06: qty 100

## 2019-09-06 MED ORDER — ONDANSETRON HCL 4 MG PO TABS: 4.0000 mg | ORAL_TABLET | Freq: Four times a day (QID) | ORAL | Status: DC | PRN

## 2019-09-06 MED ORDER — ACETAMINOPHEN 325 MG PO TABS: 325.0000 mg | ORAL_TABLET | Freq: Four times a day (QID) | ORAL | Status: DC | PRN

## 2019-09-06 MED ORDER — FENTANYL CITRATE (PF) 100 MCG/2ML IJ SOLN
50.0000 ug | INTRAMUSCULAR | Status: DC
  Administered 2019-09-06: 50 ug via INTRAVENOUS
  Filled 2019-09-06: qty 2

## 2019-09-06 MED ORDER — MEPERIDINE HCL 50 MG/ML IJ SOLN: 6.2500 mg | INTRAMUSCULAR | Status: DC | PRN

## 2019-09-06 SURGICAL SUPPLY — 55 items
BAG ZIPLOCK 12X15 (MISCELLANEOUS) IMPLANT
BENZOIN TINCTURE PRP APPL 2/3 (GAUZE/BANDAGES/DRESSINGS) IMPLANT
BLADE SAG 18X100X1.27 (BLADE) IMPLANT
BLADE SURG SZ10 CARB STEEL (BLADE) ×4 IMPLANT
BNDG ELASTIC 6X10 VLCR STRL LF (GAUZE/BANDAGES/DRESSINGS) ×1 IMPLANT
BNDG ELASTIC 6X5.8 VLCR STR LF (GAUZE/BANDAGES/DRESSINGS) ×2 IMPLANT
BOWL SMART MIX CTS (DISPOSABLE) IMPLANT
COVER SURGICAL LIGHT HANDLE (MISCELLANEOUS) ×2 IMPLANT
COVER WAND RF STERILE (DRAPES) IMPLANT
CUFF TOURN SGL QUICK 34 (TOURNIQUET CUFF) ×2
CUFF TRNQT CYL 34X4.125X (TOURNIQUET CUFF) ×1 IMPLANT
DECANTER SPIKE VIAL GLASS SM (MISCELLANEOUS) IMPLANT
DRAPE U-SHAPE 47X51 STRL (DRAPES) ×2 IMPLANT
DRSG PAD ABDOMINAL 8X10 ST (GAUZE/BANDAGES/DRESSINGS) ×3 IMPLANT
DURAPREP 26ML APPLICATOR (WOUND CARE) ×2 IMPLANT
ELECT REM PT RETURN 15FT ADLT (MISCELLANEOUS) ×2 IMPLANT
FEMORAL POSTERIOR SZ5 RT (Femur) IMPLANT
GAUZE SPONGE 4X4 12PLY STRL (GAUZE/BANDAGES/DRESSINGS) ×2 IMPLANT
GAUZE XEROFORM 1X8 LF (GAUZE/BANDAGES/DRESSINGS) ×1 IMPLANT
GLOVE BIO SURGEON STRL SZ7.5 (GLOVE) ×2 IMPLANT
GLOVE BIOGEL PI IND STRL 8 (GLOVE) ×2 IMPLANT
GLOVE BIOGEL PI INDICATOR 8 (GLOVE) ×2
GLOVE ECLIPSE 8.0 STRL XLNG CF (GLOVE) ×2 IMPLANT
GOWN STRL REUS W/TWL XL LVL3 (GOWN DISPOSABLE) ×4 IMPLANT
HANDPIECE INTERPULSE COAX TIP (DISPOSABLE) ×2
HOLDER FOLEY CATH W/STRAP (MISCELLANEOUS) IMPLANT
IMMOBILIZER KNEE 20 (SOFTGOODS) ×2 IMPLANT
IMMOBILIZER KNEE 20 THIGH 36 (SOFTGOODS) ×1 IMPLANT
INSERT TIB PS SZ5 11 KNEE (Miscellaneous) ×1 IMPLANT
KIT TURNOVER KIT A (KITS) IMPLANT
KNEE PATELLA ASYMMETRIC 10X32 (Knees) ×1 IMPLANT
KNEE TIBIAL COMPONENT SZ5 (Knees) ×1 IMPLANT
NS IRRIG 1000ML POUR BTL (IV SOLUTION) ×2 IMPLANT
PACK TOTAL KNEE CUSTOM (KITS) ×2 IMPLANT
PADDING CAST COTTON 6X4 STRL (CAST SUPPLIES) ×4 IMPLANT
PADDING CAST SYN 6 (CAST SUPPLIES) ×1
PADDING CAST SYNTHETIC 6X4 NS (CAST SUPPLIES) IMPLANT
PENCIL SMOKE EVACUATOR (MISCELLANEOUS) IMPLANT
PIN FLUTED HEDLESS FIX 3.5X1/8 (PIN) ×1 IMPLANT
POSTERIOR FEMORAL SZ5 RT (Femur) ×2 IMPLANT
PROTECTOR NERVE ULNAR (MISCELLANEOUS) ×2 IMPLANT
SET HNDPC FAN SPRY TIP SCT (DISPOSABLE) ×1 IMPLANT
SET PAD KNEE POSITIONER (MISCELLANEOUS) ×2 IMPLANT
STAPLER VISISTAT 35W (STAPLE) IMPLANT
STRIP CLOSURE SKIN 1/2X4 (GAUZE/BANDAGES/DRESSINGS) IMPLANT
SUT MNCRL AB 4-0 PS2 18 (SUTURE) IMPLANT
SUT VIC AB 0 CT1 27 (SUTURE) ×2
SUT VIC AB 0 CT1 27XBRD ANTBC (SUTURE) ×1 IMPLANT
SUT VIC AB 1 CT1 36 (SUTURE) ×4 IMPLANT
SUT VIC AB 2-0 CT1 27 (SUTURE) ×4
SUT VIC AB 2-0 CT1 TAPERPNT 27 (SUTURE) ×2 IMPLANT
TRAY FOLEY MTR SLVR 16FR STAT (SET/KITS/TRAYS/PACK) ×2 IMPLANT
WATER STERILE IRR 1000ML POUR (IV SOLUTION) ×2 IMPLANT
WRAP KNEE MAXI GEL POST OP (GAUZE/BANDAGES/DRESSINGS) ×2 IMPLANT
YANKAUER SUCT BULB TIP 10FT TU (MISCELLANEOUS) ×2 IMPLANT

## 2019-09-06 NOTE — Anesthesia Procedure Notes (Signed)
Anesthesia Regional Block: Adductor canal block   Pre-Anesthetic Checklist: ,, timeout performed, Correct Patient, Correct Site, Correct Laterality, Correct Procedure, Correct Position, site marked, Risks and benefits discussed,  Surgical consent,  Pre-op evaluation,  At surgeon's request and post-op pain management  Laterality: Right  Prep: chloraprep       Needles:  Injection technique: Single-shot  Needle Type: Stimiplex     Needle Length: 9cm  Needle Gauge: 21     Additional Needles:   Procedures:,,,, ultrasound used (permanent image in chart),,,,  Narrative:  Start time: 09/06/2019 9:34 AM End time: 09/06/2019 9:39 AM Injection made incrementally with aspirations every 5 mL.  Performed by: Personally  Anesthesiologist: [name], MD  Additional Notes: BP cuff, EKG monitors applied. Sedation begun. Artery and nerve location verified with U/S and anesthetic injected incrementally, slowly, and after negative aspirations under direct u/s guidance. Good fascial /perineural spread. Tolerated well.

## 2019-09-06 NOTE — Evaluation (Signed)
Physical Therapy Evaluation Patient Details Name: Jason Hamilton MRN: [id number] DOB: 12/26/1955 Today's Date: 09/06/2019   History of Present Illness  Patient is 64 y.o. male s/p Rt TKA on 09/06/19 with PMH significant for HLD, hepatitis, gout, OA.  Clinical Impression  Jason Hamilton is a 64 y.o. male POD 0 s/p Rt TKA. Patient reports independence with mobility at baseline with occasional use of SPC due to pain. Patient is now limited by functional impairments (see PT problem list below) and requires min assist for transfers and gait with RW. Patient was able to ambulate ~70 feet with RW and min assist. Patient instructed in exercise to facilitate ROM and circulation. Patient will benefit from continued skilled PT interventions to address impairments and progress towards PLOF. Acute PT will follow to progress mobility and stair training in preparation for safe discharge home.     Follow Up Recommendations Follow surgeon's recommendation for DC plan and follow-up therapies;Home health PT    Equipment Recommendations  Rolling walker with 5" wheels;3in1 (PT)    Recommendations for Other Services       Precautions / Restrictions Precautions Precautions: Fall Restrictions Weight Bearing Restrictions: No Other Position/Activity Restrictions: WBAT      Mobility  Bed Mobility Overal bed mobility: Needs Assistance Bed Mobility: Supine to Sit     Supine to sit: Min assist;HOB elevated     General bed mobility comments: cues to use bed rail and assist to bring Rt LE to EOB.  Transfers Overall transfer level: Needs assistance Equipment used: Rolling walker (2 wheeled) Transfers: Sit to/from Stand Sit to Stand: Min assist;From elevated surface         General transfer comment: slightly elevated surface. Pt required min assist to complete power up and steady with rise. cues needed for hand placement with RW.  Ambulation/Gait Ambulation/Gait assistance: Min assist Gait  Distance (Feet): 70 Feet Assistive device: Rolling walker (2 wheeled) Gait Pattern/deviations: Step-to pattern;Decreased stance time - right;Decreased weight shift to right Gait velocity: decreased   General Gait Details: verbal cues required for safe step pattern and proximity to RW. pt with tendency to place walker too far ahead and assist/vc's needed to correct. no overt LOB noted and no buckling at Rt knee.  Stairs       Wheelchair Mobility    Modified Rankin (Stroke Patients Only)       Balance Overall balance assessment: Needs assistance Sitting-balance support: Feet supported Sitting balance-Leahy Scale: Good     Standing balance support: During functional activity;Bilateral upper extremity supported Standing balance-Leahy Scale: Poor            Pertinent Vitals/Pain Pain Assessment: 0-10 Pain Score: 4  Pain Location: Rt knee Pain Descriptors / Indicators: Aching;Discomfort Pain Intervention(s): Limited activity within patient's tolerance;Monitored during session;Repositioned;Ice applied;Patient requesting pain meds-RN notified(RN gave meds at [facility])    Home Living Family/patient expects to be discharged to:: Private residence Living Arrangements: Alone Available Help at Discharge: Friend(s) Type of Home: Apartment Home Access: Stairs to enter Entrance Stairs-Rails: Left Entrance Stairs-Number of Steps: 4 Home Layout: One level Home Equipment: Cane - single point      Prior Function Level of Independence: Independent         Comments: Pt has been using his "dragon" cane intermittently for assistance due to knee pain.     Hand Dominance   Dominant Hand: Right    Extremity/Trunk Assessment   Upper Extremity Assessment Upper Extremity Assessment: Overall WFL for tasks assessed    Lower  Extremity Assessment Lower Extremity Assessment: RLE deficits/detail RLE Deficits / Details: good quad activation, no extensor lag with SLR. 4/5 for quad  strength with MMT RLE Sensation: WNL RLE Coordination: WNL    Cervical / Trunk Assessment Cervical / Trunk Assessment: Normal  Communication   Communication: No difficulties  Cognition Arousal/Alertness: Awake/alert Behavior During Therapy: WFL for tasks assessed/performed Overall Cognitive Status: Within Functional Limits for tasks assessed         General Comments      Exercises Total Joint Exercises Ankle Circles/Pumps: AROM;Both;20 reps;Seated Quad Sets: AROM;Right;5 reps;Seated Heel Slides: AROM;AAROM;Right;5 reps;Seated   Assessment/Plan    PT Assessment Patient needs continued PT services  PT Problem List Decreased strength;Decreased range of motion;Decreased activity tolerance;Decreased balance;Decreased mobility;Decreased knowledge of use of DME       PT Treatment Interventions DME instruction;Gait training;Stair training;Functional mobility training;Therapeutic activities;Therapeutic exercise;Balance training;Patient/family education    PT Goals (Current goals can be found in the Care Plan section)  Acute Rehab PT Goals Patient Stated Goal: to get back to independence PT Goal Formulation: With patient Time For Goal Achievement: 09/13/19 Potential to Achieve Goals: Good    Frequency 7X/week    AM-PAC PT "6 Clicks" Mobility  Outcome Measure Help needed turning from your back to your side while in a flat bed without using bedrails?: A Little Help needed moving from lying on your back to sitting on the side of a flat bed without using bedrails?: A Little Help needed moving to and from a bed to a chair (including a wheelchair)?: A Little Help needed standing up from a chair using your arms (e.g., wheelchair or bedside chair)?: A Little Help needed to walk in hospital room?: A Little Help needed climbing 3-5 steps with a railing? : A Little 6 Click Score: 18    End of Session Equipment Utilized During Treatment: Gait belt Activity Tolerance: Patient  tolerated treatment well Patient left: in chair;with call bell/phone within reach;with chair alarm set;with family/visitor present Nurse Communication: Mobility status PT Visit Diagnosis: Muscle weakness (generalized) (M62.81);Difficulty in walking, not elsewhere classified (R26.2)    Time: 2423-5361 PT Time Calculation (min) (ACUTE ONLY): 29 min   Charges:   PT Evaluation $PT Eval Low Complexity: 1 Low          Verner Mould, DPT Physical Therapist with [facility] [phone]  09/06/2019 6:39 PM

## 2019-09-06 NOTE — Anesthesia Preprocedure Evaluation (Signed)
Anesthesia Evaluation  Patient identified by MRN, date of birth, ID band Patient awake    Reviewed: Allergy & Precautions, NPO status , Patient's Chart, lab work & pertinent test results  Airway Mallampati: II  TM Distance: >3 FB Neck ROM: Full    Dental  (+) Dental Advisory Given   Pulmonary neg pulmonary ROS,    Pulmonary exam normal breath sounds clear to auscultation       Cardiovascular negative cardio ROS Normal cardiovascular exam Rhythm:Regular Rate:Normal     Neuro/Psych PSYCHIATRIC DISORDERS Depression negative neurological ROS     GI/Hepatic GERD  ,(+) Hepatitis -  Endo/Other  negative endocrine ROS  Renal/GU negative Renal ROS     Musculoskeletal  (+) Arthritis ,   Abdominal (+) + obese,   Peds  Hematology negative hematology ROS (+)   Anesthesia Other Findings   Reproductive/Obstetrics                             Anesthesia Physical Anesthesia Plan  ASA: II  Anesthesia Plan: Spinal   Post-op Pain Management:  Regional for Post-op pain   Induction: Intravenous  PONV Risk Score and Plan: 2 and Ondansetron and Dexamethasone  Airway Management Planned: Natural Airway  Additional Equipment:   Intra-op Plan:   Post-operative Plan:   Informed Consent: I have reviewed the patients History and Physical, chart, labs and discussed the procedure including the risks, benefits and alternatives for the proposed anesthesia with the patient or authorized representative who has indicated his/her understanding and acceptance.     Dental advisory given  Plan Discussed with: CRNA  Anesthesia Plan Comments:         Anesthesia Quick Evaluation

## 2019-09-06 NOTE — Progress Notes (Signed)
Assisted Dr. Germeroth with right, ultrasound guided, adductor canal block. Side rails up, monitors on throughout procedure. See vital signs in flow sheet. Tolerated Procedure well. 

## 2019-09-06 NOTE — H&P (Signed)
The patient is seen and examined at bedside this morning.  He reports no acute changes in medical status.  See previous H&P that is recent.  He understands we are performing a right total knee arthroplasty.  The surgery been discussed in detail and the risk and benefits have been explained in detail.  The right knee has been marked and informed consent is obtained.

## 2019-09-06 NOTE — Brief Op Note (Signed)
09/06/2019  11:50 AM  PATIENT:  Jason Hamilton  64 y.o. male  PRE-OPERATIVE DIAGNOSIS:  osteoarthritis right knee  POST-OPERATIVE DIAGNOSIS:  osteoarthritis right knee  PROCEDURE:  Procedure(s): RIGHT TOTAL KNEE ARTHROPLASTY (Right)  SURGEON:  Surgeon(s) and Role:    Kathryne Hitch, MD - Primary  PHYSICIAN ASSISTANT:  Rexene Edison, PA-C  ANESTHESIA:   regional and spinal  COUNTS:  YES  TOURNIQUET:   Total Tourniquet Time Documented: Thigh (Right) - 52 minutes Total: Thigh (Right) - 52 minutes   DICTATION: .Other Dictation: Dictation Number [id number]  PLAN OF CARE: Admit to inpatient   PATIENT DISPOSITION:  PACU - hemodynamically stable.   Delay start of Pharmacological VTE agent (>24hrs) due to surgical blood loss or risk of bleeding: no

## 2019-09-06 NOTE — Plan of Care (Signed)
Plan of care 

## 2019-09-06 NOTE — Anesthesia Postprocedure Evaluation (Signed)
Anesthesia Post Note  Patient: Jason Hamilton  Procedure(s) Performed: RIGHT TOTAL KNEE ARTHROPLASTY (Right Knee)     Patient location during evaluation: PACU Anesthesia Type: Spinal Level of consciousness: awake and alert Pain management: pain level controlled Vital Signs Assessment: post-procedure vital signs reviewed and stable Respiratory status: spontaneous breathing Cardiovascular status: stable Anesthetic complications: no    Last Vitals:  Vitals:   09/06/19 1345 09/06/19 1434  BP: 131/77 133/75  Pulse: 71 75  Resp: 15 20  Temp:  36.6 C  SpO2: 100% 99%    Last Pain:  Vitals:   09/06/19 1434  TempSrc: Oral  PainSc:                  [name]

## 2019-09-06 NOTE — Op Note (Signed)
NAMEDEITRICK, FERRERI MEDICAL RECORD BS:[id number] ACCOUNT [id number] DATE OF BIRTH:[birth year] FACILITY: [facility] LOCATION: [facility] PHYSICIAN:Kateleen Encarnacion Kerry Fort, MD  OPERATIVE REPORT  DATE OF PROCEDURE:  09/06/2019  PREOPERATIVE DIAGNOSIS:  Primary osteoarthritis and degenerative joint disease with varus malalignment, right knee.  POSTOPERATIVE DIAGNOSIS:  Primary osteoarthritis and degenerative joint disease with varus malalignment, right knee.  PROCEDURE:  Right total knee arthroplasty.  IMPLANTS:  Stryker Triathlon press-fit knee system with size 5 femur, size 5 tibial tray, 11 mm fixed bearing polyethylene insert, size 32 press-fit patellar button.  SURGEON:  Lind Guest. Ninfa Linden, MD  ASSISTANT:  Erskine Emery, PA-C.  ANESTHESIA: 1.  Right lower extremity adductor canal block. 2.  Spinal.  TIME:  Less than 1 hour.  ANTIBIOTICS:  Two g IV Ancef.  ESTIMATED BLOOD LOSS:  Less  than 100 mL.  COMPLICATIONS:  None.  INDICATIONS:  The patient is a 64 year old gentleman, well known to me.  He has debilitating arthritis involving his right knee with varus malalignment.  He has tried and failed all forms of conservative treatment from over a year now.  His pain is daily  with his right knee and it is detrimentally affecting his mobility, his quality of life and his activities of daily living.  The knee does show significant varus malalignment with complete loss of joint space, especially at the medial aspect.  There  were large periarticular osteophytes in all 3 compartments on x-ray.  At this point, given the failure of treatment, we have recommended total knee arthroplasty.  I had a long and thorough and detailed discussion about the risks and benefits of this  surgery, including the risk of acute blood loss anemia, nerve or vessel injury, fracture, infection, DVT, and implant failure.  We talked about our goals being hopefully decreased pain, improved mobility and overall  improved quality of life.  DESCRIPTION OF PROCEDURE:  After informed consent was obtained, the appropriate right knee was marked.  An adductor canal block was obtained in the holding room of the right lower extremity.   He was then brought to the operating room and sat up on the  operating table where spinal anesthesia was then obtained.  He was then laid in the supine position on the operating table.  A Foley catheter was placed.  A nonsterile tourniquet was placed on his upper right thigh.  His right thigh, knee, leg, ankle and  foot were prepped and draped with DuraPrep and sterile drapes, including a sterile stockinette.  Time-out was called to identify correct patient and correct right knee.  We then used an Esmarch to wrap that leg and tourniquet was inflated to 300 mm of  pressure.  I then made a direct midline incision over the patella and carried this proximally and distally.  I dissected down to the knee joint and carried out a medial parapatellar arthrotomy, finding a large joint effusion and significant disease  throughout the knee, especially the medial compartment.  With the knee in a flexed position, we removed remnants of ACL, PCL, medial and lateral meniscus.  We set our extramedullary cutting guide for taking 9 mm off the high side, correcting varus and  valgus and neutral slope.  Once we made that cut, we took an additional 2 mm off of this.  We then used an intramedullary guide for our distal femoral cutting block placement.  We set this for right knee at 5 degrees externally rotated for a 10 mm distal  femoral cut.  We made this  cut without difficulty and brought the knee back down to full extension with a 9 mm extension block.  We felt like we had achieved full extension.  We went back to the femur and put our femoral sizing guide based off the  epicondylar axis and Whitesides line.  Based off of this, we chose a size 5 femur.  We put a 4-in-1 cutting block for a size 5 femur, made our  anterior and posterior cuts, followed by our chamfer cuts.  We then made our femoral box cut.  Attention was  turned back to the tibia.  We chose a size 5 tray for tibial coverage, setting the rotation off the tibial tubercle and the femur and pleased with the coverage.  We did our keel punch based on for a press-fit size 5 tibial tray.  With a size 5 trial tray  in place, followed by the size 5 right trial femur, we tried a 9 mm fixed bearing polyethylene insert.  It was stable, but I felt like we just needed a little bit more thickness poly with the real implant.  We did make our patellar cut and drilled 3  holes for a press-fit patellar button, size 32.  We then removed all instrumentation from the knee and cleaned every bit of debris we could from the back of the knee.  We irrigated the knee with normal saline solution using pulsatile lavage.  We dried it  the real well and then with the knee in a flexed position, we placed our real pressfit Stryker tibial tray after the final tibial preparation.  This was a size 5 tibia.  We placed the size 5 femur and went with the 11 mm fixed bearing polyethylene  insert.  We press-fit our patellar button.  With all instrumentation removed, we put the knee through range of motion and cycled it several times and we were pleased with stability and range of motion assessed mechanically.  We then let the tourniquet  down.  Hemostasis obtained with electrocautery.  We closed the arthrotomy with interrupted #1 Vicryl suture, followed by 0 Vicryl to close deep tissue, 2-0 Vicryl to close the subcutaneous tissue and interrupted staples to reapproximate the skin.   Xeroform well-padded sterile dressing was applied.  He was taken to the recovery room in stable condition.  All final counts were correct.  There were no complications noted.  Of note, Rexene Edison, PA-C, assisted during the entire case.  His assistance was  crucial for facilitating all aspects of this  case.  VN/NUANCE  D:09/06/2019 T:09/06/2019 JOB:[id number]

## 2019-09-06 NOTE — Transfer of Care (Signed)
Immediate Anesthesia Transfer of Care Note  Patient: Jason Hamilton  Procedure(s) Performed: RIGHT TOTAL KNEE ARTHROPLASTY (Right Knee)  Patient Location: PACU  Anesthesia Type:MAC and Spinal  Level of Consciousness: awake, alert , oriented and patient cooperative  Airway & Oxygen Therapy: Patient Spontanous Breathing and Patient connected to face mask oxygen  Post-op Assessment: Report given to RN and Post -op Vital signs reviewed and stable  Post vital signs: Reviewed and stable  Last Vitals:  Vitals Value Taken Time  BP 112/59 09/06/19 1216  Temp    Pulse 85 09/06/19 1218  Resp 18 09/06/19 1218  SpO2 98 % 09/06/19 1218  Vitals shown include unvalidated device data.  Last Pain:  Vitals:   09/06/19 1000  TempSrc:   PainSc: 0-No pain      Patients Stated Pain Goal: 5 (30/09/23 3007)  Complications: No apparent anesthesia complications

## 2019-09-06 NOTE — Anesthesia Procedure Notes (Signed)
Spinal  Start time: 09/06/2019 10:16 AM End time: 09/06/2019 10:20 AM Staffing Resident/CRNA: Victoriano Lain, CRNA Preanesthetic Checklist Completed: patient identified, IV checked, site marked, risks and benefits discussed, surgical consent, monitors and equipment checked, pre-op evaluation and timeout performed Spinal Block Patient position: sitting Prep: DuraPrep and site prepped and draped Patient monitoring: heart rate, continuous pulse ox and blood pressure Approach: midline Location: L3-4 Needle Needle type: Pencan  Needle gauge: 24 G Needle length: 10 cm Additional Notes Spinal kit expiration date checked and verified. Pt placed in sitting position for spinal placement. Clear CSF obtained on 2nd pass. - heme. Pt tolerated well and placed supine.

## 2019-09-06 NOTE — Progress Notes (Signed)
Orthopedic Tech Progress Note Patient Details:  Jason Hamilton 10-May-1956 [id number]  CPM Right Knee CPM Right Knee: On Right Knee Flexion (Degrees): 60 Right Knee Extension (Degrees): 0  Post Interventions Patient Tolerated: Well Instructions Provided: Care of device, Adjustment of device Ortho Devices Type of Ortho Device: CPM padding Ortho Device/Splint Location: Right Knee Ortho Device/Splint Interventions: Ordered, Application   Post Interventions Patient Tolerated: Well Instructions Provided: Care of device, Adjustment of device   Jason Hamilton 09/06/2019, 1:29 PM

## 2019-09-07 ENCOUNTER — Encounter (HOSPITAL_COMMUNITY): Payer: Self-pay | Admitting: Orthopaedic Surgery

## 2019-09-07 DIAGNOSIS — M1711 Unilateral primary osteoarthritis, right knee: Secondary | ICD-10-CM | POA: Diagnosis not present

## 2019-09-07 LAB — BASIC METABOLIC PANEL
Anion gap: 8 (ref 5–15)
BUN: 21 mg/dL (ref 8–23)
CO2: 24 mmol/L (ref 22–32)
Calcium: 8.7 mg/dL — ABNORMAL LOW (ref 8.9–10.3)
Chloride: 105 mmol/L (ref 98–111)
Creatinine, Ser: 1.12 mg/dL (ref 0.61–1.24)
GFR calc Af Amer: >60 mL/min (ref >60)
GFR calc non Af Amer: >60 mL/min (ref >60)
Glucose, Bld: 151 mg/dL — ABNORMAL HIGH (ref 70–99)
Potassium: 4.3 mmol/L (ref 3.5–5.1)
Sodium: 137 mmol/L (ref 135–145)

## 2019-09-07 LAB — CBC
HCT: 35.7 % — ABNORMAL LOW (ref 39.0–52.0)
Hemoglobin: 11.5 g/dL — ABNORMAL LOW (ref 13.0–17.0)
MCH: 27.2 pg (ref 26.0–34.0)
MCHC: 32.2 g/dL (ref 30.0–36.0)
MCV: 84.4 fL (ref 80.0–100.0)
Platelets: 188 10*3/uL (ref 150–400)
RBC: 4.23 MIL/uL (ref 4.22–5.81)
RDW: 13.6 % (ref 11.5–15.5)
WBC: 12.9 10*3/uL — ABNORMAL HIGH (ref 4.0–10.5)
nRBC: 0 % (ref 0.0–0.2)

## 2019-09-07 MED ORDER — METHOCARBAMOL 500 MG PO TABS: 500.0000 mg | ORAL_TABLET | Freq: Four times a day (QID) | ORAL | 0 refills | Status: DC | PRN

## 2019-09-07 MED ORDER — OXYCODONE HCL 5 MG PO TABS: 5.0000 mg | ORAL_TABLET | ORAL | 0 refills | Status: DC | PRN

## 2019-09-07 MED ORDER — ASPIRIN 325 MG PO TBEC: 325.0000 mg | DELAYED_RELEASE_TABLET | Freq: Two times a day (BID) | ORAL | 0 refills | Status: DC

## 2019-09-07 NOTE — Discharge Instructions (Signed)

## 2019-09-07 NOTE — Discharge Summary (Signed)
Patient ID: Jason Hamilton MRN: [id number] DOB/AGE: Jan 10, 1956 64 y.o.  Admit date: 09/06/2019 Discharge date: 09/07/2019  Admission Diagnoses:  Principal Problem:   Unilateral primary osteoarthritis, right knee Active Problems:   Status post total right knee replacement   Discharge Diagnoses:  Same  Past Medical History:  Diagnosis Date  . Arthritis   . Gout   . Hepatitis    A   treated   25-30 years ago  . Hyperlipidemia     Surgeries: Procedure(s): RIGHT TOTAL KNEE ARTHROPLASTY on 09/06/2019   Consultants:   Discharged Condition: Improved  Hospital Course: Jason Hamilton is an 64 y.o. male who was admitted 09/06/2019 for operative treatment ofUnilateral primary osteoarthritis, right knee. Patient has severe unremitting pain that affects sleep, daily activities, and work/hobbies. After pre-op clearance the patient was taken to the operating room on 09/06/2019 and underwent  Procedure(s): RIGHT TOTAL KNEE ARTHROPLASTY.    Patient was given perioperative antibiotics:  Anti-infectives (From admission, onward)   Start     Dose/Rate Route Frequency Ordered Stop   09/06/19 1600  ceFAZolin (ANCEF) IVPB 1 g/50 mL premix     1 g 100 mL/hr over 30 Minutes Intravenous Every 6 hours 09/06/19 1440 09/06/19 2159   09/06/19 0730  ceFAZolin (ANCEF) IVPB 2g/100 mL premix     2 g 200 mL/hr over 30 Minutes Intravenous On call to O.R. 09/06/19 0729 09/06/19 1051       Patient was given sequential compression devices, early ambulation, and chemoprophylaxis to prevent DVT.  Patient benefited maximally from hospital stay and there were no complications.    Recent vital signs:  Patient Vitals for the past 24 hrs:  BP Temp Temp src Pulse Resp SpO2 Height Weight  09/07/19 0455 137/70 98.3 F (36.8 C) Oral 65 15 98 % -- --  09/07/19 0132 (!) 145/76 97.9 F (36.6 C) Oral 71 16 99 % -- --  09/06/19 2033 131/72 98.1 F (36.7 C) Oral 78 16 96 % -- --  09/06/19 1808 137/77 98.5 F (36.9  C) Oral 75 17 98 % -- --  09/06/19 1638 (!) 149/84 98.5 F (36.9 C) Oral 78 18 99 % -- --  09/06/19 1540 (!) 147/93 97.9 F (36.6 C) Oral 62 16 100 % -- --  09/06/19 1434 133/75 97.8 F (36.6 C) Oral 75 20 99 % 5\' 8"  (1.727 m) 115.5 kg  09/06/19 1345 131/77 -- -- 71 15 100 % -- --  09/06/19 1330 128/79 -- -- 69 17 100 % -- --  09/06/19 1315 112/68 -- -- 65 20 100 % -- --  09/06/19 1300 (!) 106/55 -- -- 76 20 99 % -- --  09/06/19 1245 109/74 -- -- 81 18 100 % -- --  09/06/19 1230 93/65 -- -- 77 19 98 % -- --  09/06/19 1215 (!) 112/59 98.4 F (36.9 C) -- 85 16 100 % -- --  09/06/19 1000 (!) 123/95 -- -- (!) 59 14 100 % -- --  09/06/19 0956 130/81 -- -- 60 17 100 % -- --  09/06/19 0951 130/79 -- -- 60 17 100 % -- --  09/06/19 0946 132/79 -- -- (!) 59 16 100 % -- --  09/06/19 0941 133/79 -- -- 62 18 99 % -- --  09/06/19 0936 137/62 -- -- (!) 58 18 99 % -- --     Recent laboratory studies:  Recent Labs    09/07/19 0326  WBC 12.9*  HGB 11.5*  HCT 35.7*  PLT  188  NA 137  K 4.3  CL 105  CO2 24  BUN 21  CREATININE 1.12  GLUCOSE 151*  CALCIUM 8.7*     Discharge Medications:   Allergies as of 09/07/2019   No Known Allergies     Medication List    STOP taking these medications   BLUE GEL EX   Voltaren 1 % Gel Generic drug: diclofenac Sodium     TAKE these medications   acetaminophen 500 MG tablet Commonly known as: TYLENOL Take 1,000 mg by mouth every 6 (six) hours as needed for moderate pain.   allopurinol 100 MG tablet Commonly known as: ZYLOPRIM Take 100 mg by mouth daily.   aspirin 325 MG EC tablet Take 1 tablet (325 mg total) by mouth 2 (two) times daily after a meal.   atorvastatin 10 MG tablet Commonly known as: LIPITOR Take 10 mg by mouth daily.   cetirizine 10 MG tablet Commonly known as: ZYRTEC Take 10 mg by mouth daily.   fluticasone 50 MCG/ACT nasal spray Commonly known as: FLONASE Place 2 sprays into both nostrils daily as needed for  allergies.   methocarbamol 500 MG tablet Commonly known as: ROBAXIN Take 1 tablet (500 mg total) by mouth every 6 (six) hours as needed for muscle spasms.   oxyCODONE 5 MG immediate release tablet Commonly known as: Oxy IR/ROXICODONE Take 1-2 tablets (5-10 mg total) by mouth every 4 (four) hours as needed for moderate pain (pain score 4-6).            Durable Medical Equipment  (From admission, onward)         Start     Ordered   09/06/19 1441  DME 3 n 1  Once     09/06/19 1440   09/06/19 1441  DME Walker rolling  Once    Question Answer Comment  Walker: With 5 Inch Wheels   Patient needs a walker to treat with the following condition Status post total right knee replacement      09/06/19 1440          Diagnostic Studies: DG Knee Right Port  Result Date: 09/06/2019 CLINICAL DATA:  Postop knee replacement EXAM: PORTABLE RIGHT KNEE - 1-2 VIEW COMPARISON:  None. FINDINGS: Rotated AP and lateral views of the knee show the patient to be status post tricompartmental knee replacement. No evidence for immediate hardware complication. Gas in the overlying soft tissues is compatible with the immediate postoperative state. Skin staples noted anterior midline. IMPRESSION: Status post tricompartmental knee replacement without evidence for immediate hardware complication. Electronically Signed   By: Kennith Center M.D.   On: 09/06/2019 12:57   XR Knee 1-2 Views Right  Result Date: 08/12/2019 2 views of the right knee show severe end-stage arthritis of the right knee.  There is varus malalignment.  There is complete loss of the medial joint space.  There are large periarticular osteophytes in all 3 compartments with severe patellofemoral disease as well.  There is also sclerotic changes in all 3 compartments.   Disposition: Discharge disposition: 01-Home or Self Care         Follow-up Information    Kathryne Hitch, MD Follow up in 2 week(s).   Specialty: Orthopedic  Surgery Contact information: [address] [place] [address] [phone]            Signed: Kathryne Hitch 09/07/2019, 9:35 AM

## 2019-09-07 NOTE — TOC Initial Note (Addendum)
Transition of Care [facility]) - Initial/Assessment Note    Patient Details  Name: Jason Hamilton MRN: [id number] Date of Birth: December 25, 1955  [facility]) CM/SW Contact:    Joaquin Courts, RN Phone Number: 09/07/2019, 11:32 AM  Clinical Narrative:   CM spoke with patient at bedside. [employer] to deliver rolling walker and 3in1 to bedside for home use.  [facility] to provide [place] services.  Patient also expressing interest in purchasing tub bench. [employer] does not have any to provide today but will get a shipment in on Monday. Patient will have family member pick up the tub bench from [employer] store.                   Expected Discharge Plan: [place] Barriers to Discharge: No Barriers Identified   Patient Goals and CMS Choice Patient states their goals for this hospitalization and ongoing recovery are:: to go home with therapy CMS Medicare.gov Compare Post Acute Care list provided to:: Patient Choice offered to / list presented to : Patient  Expected Discharge Plan and Services Expected Discharge Plan: [place]   Discharge Planning Services: CM Consult Post Acute Care Choice: [place] arrangements for the past 2 months: Single Family Home Expected Discharge Date: 09/07/19               DME Arranged: Gilford Rile rolling, 3-N-1 DME Agency: [employer] Date DME Agency Contacted: 09/07/19 Time DME Agency Contacted: [id number] Representative spoke with at DME Agency: [place]: PT [place]: [facility] (formerly [employer]) Date [place]: 09/07/19 Time [place]: 1129 Representative spoke with at [place]: Alwyn Ren  Prior Living Arrangements/Services Living arrangements for the past 2 months: [place]   Patient language and need for interpreter reviewed:: Yes Do you feel safe going back to the place where you live?: Yes      Need for Family Participation in Patient Care: Yes  (Comment) Care giver support system in place?: Yes (comment)   Criminal Activity/Legal Involvement Pertinent to Current Situation/Hospitalization: No - Comment as needed  Activities of Daily Living Home Assistive Devices/Equipment: Cane (specify quad or straight), Eyeglasses ADL Screening (condition at time of admission) Patient's cognitive ability adequate to safely complete daily activities?: Yes Is the patient deaf or have difficulty hearing?: No Does the patient have difficulty seeing, even when wearing glasses/contacts?: No Does the patient have difficulty concentrating, remembering, or making decisions?: No Patient able to express need for assistance with ADLs?: Yes Does the patient have difficulty dressing or bathing?: No Independently performs ADLs?: Yes (appropriate for developmental age) Does the patient have difficulty walking or climbing stairs?: Yes Weakness of Legs: Right Weakness of Arms/Hands: None  Permission Sought/Granted                  Emotional Assessment Appearance:: Appears stated age Attitude/Demeanor/Rapport: Engaged Affect (typically observed): Accepting Orientation: : Oriented to Self, Oriented to Place, Oriented to  Time, Oriented to Situation   Psych Involvement: No (comment)  Admission diagnosis:  Status post total right knee replacement [Z96.651] Patient Active Problem List   Diagnosis Date Noted  . Status post total right knee replacement 09/06/2019  . Unilateral primary osteoarthritis, right knee 08/12/2019  . GERD 09/25/2008  . 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 GASTRIC ULCER, HX OF 09/25/2008  . DEPRESSION 08/25/2008   PCP:  Nolene Ebbs, MD Pharmacy:   [id number] - [place], [place] -  [address] AT [facility] [address] [place] [address] Phone: [phone] Fax: [phone]     Social Determinants of Health (SDOH) Interventions    Readmission Risk  Interventions No flowsheet data found.

## 2019-09-07 NOTE — Progress Notes (Signed)
Physical Therapy Treatment Patient Details Name: Jason Hamilton MRN: [id number] DOB: 1956/05/24 Today's Date: 09/07/2019    History of Present Illness Patient is 64 y.o. male s/p Rt TKA on 09/06/19 with PMH significant for HLD, hepatitis, gout, OA.    PT Comments    POD # 2 pm session Pt in bed and dressed.  Demonstarted and instructed on TKR TE's following handout.  Performed 5 reps all following sheet.  Instructed on proper tech, freq as well as use of ICE.  Addressed all mobility questions, discussed appropriate activity, educated on use of ICE.  Pt ready for D/C to home.   Follow Up Recommendations  Follow surgeon's recommendation for DC plan and follow-up therapies;Home health PT     Equipment Recommendations  Rolling walker with 5" wheels;3in1 (PT)    Recommendations for Other Services       Precautions / Restrictions Precautions Precautions: Fall Precaution Comments: instructed no pillow under knee Restrictions Weight Bearing Restrictions: No Other Position/Activity Restrictions: WBAT    Mobility  Bed Mobility Overal bed mobility: Needs Assistance Bed Mobility: Supine to Sit;Sit to Supine     Supine to sit: Supervision;Min guard Sit to supine: Supervision;Min guard   General bed mobility comments: demonstarted and instructed how to use a belt to self assist LE on/off bed Sat EOB to perform TE's           Cognition Arousal/Alertness: Awake/alert Behavior During Therapy: [facility] for tasks assessed/performed Overall Cognitive Status: Within Functional Limits for tasks assessed                                        Exercises   Total Knee Replacement TE's following HEP handout 10 reps B LE ankle pumps 05 reps towel squeezes 05 reps knee presses 05 reps heel slides  05 reps SAQ's 05 reps SLR's 05 reps ABD 05 reps seated knee bends 05 reps LAQ's Educated on use of gait belt to assist with TE's Followed by ICE     General Comments         Pertinent Vitals/Pain Pain Assessment: 0-10 Pain Score: 5  Pain Location: Rt knee Pain Descriptors / Indicators: Aching;Discomfort;Operative site guarding Pain Intervention(s): Premedicated before session;Repositioned    Home Living                      Prior Function            PT Goals (current goals can now be found in the care plan section) Progress towards PT goals: Progressing toward goals    Frequency    7X/week      PT Plan Current plan remains appropriate    Co-evaluation              AM-PAC PT "6 Clicks" Mobility   Outcome Measure  Help needed turning from your back to your side while in a flat bed without using bedrails?: A Little Help needed moving from lying on your back to sitting on the side of a flat bed without using bedrails?: A Little Help needed moving to and from a bed to a chair (including a wheelchair)?: A Little Help needed standing up from a chair using your arms (e.g., wheelchair or bedside chair)?: A Little Help needed to walk in hospital room?: A Little Help needed climbing 3-5 steps with a railing? : A Little 6 Click Score: 18  End of Session Equipment Utilized During Treatment: Gait belt Activity Tolerance: Patient tolerated treatment well Patient left: in bed;with call bell/phone within reach Nurse Communication: Mobility status(pt will need 2 PT sessions prior to D/C to home) PT Visit Diagnosis: Muscle weakness (generalized) (M62.81);Difficulty in walking, not elsewhere classified (R26.2)     Time: 1355-1420 PT Time Calculation (min) (ACUTE ONLY): 25 min  Charges:   $Therapeutic Exercise: 8-22 mins $Self Care/Home Management: 8-22                     Rica Koyanagi  PTA Acute  Rehabilitation Services Pager      [phone] Office      [phone]

## 2019-09-07 NOTE — Plan of Care (Signed)

## 2019-09-07 NOTE — Progress Notes (Signed)
Subjective: 1 Day Post-Op Procedure(s) (LRB): RIGHT TOTAL KNEE ARTHROPLASTY (Right) Patient reports pain as moderate.    Objective: Vital signs in last 24 hours: Temp:  [97.8 F (36.6 C)-98.5 F (36.9 C)] 98.3 F (36.8 C) (04/10 0455) Pulse Rate:  [58-85] 65 (04/10 0455) Resp:  [14-20] 15 (04/10 0455) BP: (93-149)/(55-95) 137/70 (04/10 0455) SpO2:  [96 %-100 %] 98 % (04/10 0455) Weight:  [115.5 kg] 115.5 kg (04/09 1434)  Intake/Output from previous day: 04/09 0701 - 04/10 0700 In: 3593.6 [P.O.:220; I.V.:3123.6; IV Piggyback:250] Out: 2675 [Urine:2625; Blood:50] Intake/Output this shift: No intake/output data recorded.  Recent Labs    09/07/19 0326  HGB 11.5*   Recent Labs    09/07/19 0326  WBC 12.9*  RBC 4.23  HCT 35.7*  PLT 188   Recent Labs    09/07/19 0326  NA 137  K 4.3  CL 105  CO2 24  BUN 21  CREATININE 1.12  GLUCOSE 151*  CALCIUM 8.7*   No results for input(s): LABPT, INR in the last 72 hours.  Sensation intact distally Intact pulses distally Dorsiflexion/Plantar flexion intact Incision: dressing C/D/I No cellulitis present Compartment soft   Assessment/Plan: 1 Day Post-Op Procedure(s) (LRB): RIGHT TOTAL KNEE ARTHROPLASTY (Right) Up with therapy Discharge home with home health this afternoon after 2nd therapy session    Patient's anticipated LOS is less than 2 midnights, meeting these requirements: - Younger than 49 - Lives within 1 hour of care - Has a competent adult at home to recover with post-op recover - NO history of  - Chronic pain requiring opiods  - Diabetes  - Coronary Artery Disease  - Heart failure  - Heart attack  - Stroke  - DVT/VTE  - Cardiac arrhythmia  - Respiratory Failure/COPD  - Renal failure  - Anemia  - Advanced Liver disease       Kathryne Hitch 09/07/2019, 9:31 AM

## 2019-09-07 NOTE — Progress Notes (Signed)
Physical Therapy Treatment Patient Details Name: Jason Hamilton MRN: [id number] DOB: 07-Mar-1956 Today's Date: 09/07/2019    History of Present Illness Patient is 64 y.o. male s/p Rt TKA on 09/06/19 with PMH significant for HLD, hepatitis, gout, OA.    PT Comments    POD # 1 am session Assisted OOB.  General bed mobility comments: demonstarted and instructed how to use a belt to self assist LE on/off bed.  General transfer comment: 25% VC's on proper tech and safety with turns.  General stair comments: 50% VC's on proper sequencing and safety.  Assisted back to bed and applied ICE.  Pt will need another PT session to address HEP.   Follow Up Recommendations  Follow surgeon's recommendation for DC plan and follow-up therapies;Home health PT     Equipment Recommendations  Rolling walker with 5" wheels;3in1 (PT)    Recommendations for Other Services       Precautions / Restrictions Precautions Precautions: Fall Precaution Comments: instructed no pillow under knee Restrictions Weight Bearing Restrictions: No Other Position/Activity Restrictions: WBAT    Mobility  Bed Mobility Overal bed mobility: Needs Assistance Bed Mobility: Supine to Sit;Sit to Supine     Supine to sit: Supervision;Min guard Sit to supine: Supervision;Min guard   General bed mobility comments: demonstarted and instructed how to use a belt to self assist LE on/off bed  Transfers Overall transfer level: Needs assistance Equipment used: Rolling walker (2 wheeled) Transfers: Sit to/from Stand Sit to Stand: Supervision;Min guard         General transfer comment: 25% VC's on proper tech and safety with turns  Ambulation/Gait Ambulation/Gait assistance: Supervision;Min guard Gait Distance (Feet): 65 Feet Assistive device: Rolling walker (2 wheeled) Gait Pattern/deviations: Step-to pattern;Decreased stance time - right;Decreased weight shift to right Gait velocity: decreased   General Gait Details:  25% VC's on proper walker to self distance and upright posture.   Stairs Stairs: Yes Stairs assistance: Supervision;Min guard Stair Management: One rail Left;Step to pattern;Forwards Number of Stairs: 2 General stair comments: 50% VC's on proper sequencing and safety   Wheelchair Mobility    Modified Rankin (Stroke Patients Only)       Balance                                            Cognition Arousal/Alertness: Awake/alert Behavior During Therapy: WFL for tasks assessed/performed Overall Cognitive Status: Within Functional Limits for tasks assessed                                        Exercises      General Comments        Pertinent Vitals/Pain Pain Assessment: 0-10 Pain Score: 5  Pain Location: Rt knee Pain Descriptors / Indicators: Aching;Discomfort;Operative site guarding Pain Intervention(s): Premedicated before session;Repositioned    Home Living                      Prior Function            PT Goals (current goals can now be found in the care plan section) Progress towards PT goals: Progressing toward goals    Frequency    7X/week      PT Plan Current plan remains appropriate    Co-evaluation  AM-PAC PT "6 Clicks" Mobility   Outcome Measure  Help needed turning from your back to your side while in a flat bed without using bedrails?: A Little Help needed moving from lying on your back to sitting on the side of a flat bed without using bedrails?: A Little Help needed moving to and from a bed to a chair (including a wheelchair)?: A Little Help needed standing up from a chair using your arms (e.g., wheelchair or bedside chair)?: A Little Help needed to walk in hospital room?: A Little Help needed climbing 3-5 steps with a railing? : A Little 6 Click Score: 18    End of Session Equipment Utilized During Treatment: Gait belt Activity Tolerance: Patient tolerated treatment  well Patient left: in bed;with call bell/phone within reach Nurse Communication: Mobility status(pt will need 2 PT sessions prior to D/C to home) PT Visit Diagnosis: Muscle weakness (generalized) (M62.81);Difficulty in walking, not elsewhere classified (R26.2)     Time: 1035-1100 PT Time Calculation (min) (ACUTE ONLY): 25 min  Charges:  $Gait Training: 8-22 mins $Therapeutic Activity: 23-37 mins                     Felecia Shelling  PTA Acute  Rehabilitation Services Pager      [phone] Office      [phone]

## 2019-09-09 ENCOUNTER — Encounter: Payer: Self-pay | Admitting: *Deleted

## 2019-09-19 ENCOUNTER — Other Ambulatory Visit: Payer: Self-pay

## 2019-09-19 ENCOUNTER — Ambulatory Visit (INDEPENDENT_AMBULATORY_CARE_PROVIDER_SITE_OTHER): Payer: Medicaid Other | Admitting: Physician Assistant

## 2019-09-19 ENCOUNTER — Encounter: Payer: Self-pay | Admitting: Physician Assistant

## 2019-09-19 DIAGNOSIS — Z96651 Presence of right artificial knee joint: Secondary | ICD-10-CM

## 2019-09-19 MED ORDER — OXYCODONE HCL 5 MG PO TABS: 5.0000 mg | ORAL_TABLET | ORAL | 0 refills | Status: DC | PRN

## 2019-09-19 NOTE — Addendum Note (Signed)
Addended by: Mardene Celeste B on: 09/19/2019 01:37 PM   Modules accepted: Orders

## 2019-09-19 NOTE — Progress Notes (Signed)
HPI: Jason Hamilton returns today 2-week status post right total knee arthroplasty.  He states he is overall doing well.  Mostly has pain at night and first thing in the morning this does awaken him.  Said no dizziness chest pain fevers.  He has had some chills.  He feels overall that he is doing well he is ambulating with a cane or walker comes in today on a walker.  Physical exam: Right knee staples well approximate the incision.  No signs of infection.  Is near full extension and flexes to 80 degrees.  Calf supple nontender.  Impression: Status post right total knee arthroplasty 09/06/2019  Plan: Staples removed Steri-Strips applied.  Is able to get the incision wet in the shower.  Discussed with he should not drive until 6 weeks postop.  Refer him to outpatient physical therapy to work on further range of motion strengthening.  Scar tissue mobilization encouraged.  Questions encouraged and answered.  Follow-up in 4 weeks

## 2019-09-26 ENCOUNTER — Other Ambulatory Visit: Payer: Self-pay

## 2019-09-26 ENCOUNTER — Ambulatory Visit: Payer: Medicaid Other | Attending: Physician Assistant

## 2019-09-26 DIAGNOSIS — G8929 Other chronic pain: Secondary | ICD-10-CM | POA: Diagnosis present

## 2019-09-26 DIAGNOSIS — R600 Localized edema: Secondary | ICD-10-CM | POA: Diagnosis present

## 2019-09-26 DIAGNOSIS — R262 Difficulty in walking, not elsewhere classified: Secondary | ICD-10-CM | POA: Insufficient documentation

## 2019-09-26 DIAGNOSIS — M6281 Muscle weakness (generalized): Secondary | ICD-10-CM | POA: Insufficient documentation

## 2019-09-26 DIAGNOSIS — M25561 Pain in right knee: Secondary | ICD-10-CM | POA: Insufficient documentation
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
## 2019-10-01 ENCOUNTER — Other Ambulatory Visit: Payer: Self-pay

## 2019-10-01 ENCOUNTER — Ambulatory Visit: Payer: Medicaid Other | Attending: Physician Assistant

## 2019-10-01 DIAGNOSIS — G8929 Other chronic pain: Secondary | ICD-10-CM

## 2019-10-01 DIAGNOSIS — R600 Localized edema: Secondary | ICD-10-CM | POA: Insufficient documentation

## 2019-10-01 DIAGNOSIS — R262 Difficulty in walking, not elsewhere classified: Secondary | ICD-10-CM | POA: Insufficient documentation

## 2019-10-01 DIAGNOSIS — M6281 Muscle weakness (generalized): Secondary | ICD-10-CM

## 2019-10-01 DIAGNOSIS — M25561 Pain in right knee: Secondary | ICD-10-CM | POA: Insufficient documentation
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
## 2019-10-02 ENCOUNTER — Other Ambulatory Visit: Payer: Self-pay | Admitting: Orthopaedic Surgery

## 2019-10-03 NOTE — Telephone Encounter (Signed)
Please advise 

## 2019-10-04 ENCOUNTER — Ambulatory Visit: Payer: Medicaid Other | Admitting: Physical Therapy

## 2019-10-04 ENCOUNTER — Other Ambulatory Visit: Payer: Self-pay

## 2019-10-04 ENCOUNTER — Encounter: Payer: Self-pay | Admitting: Physical Therapy

## 2019-10-04 DIAGNOSIS — M25561 Pain in right knee: Secondary | ICD-10-CM

## 2019-10-04 DIAGNOSIS — G8929 Other chronic pain: Secondary | ICD-10-CM

## 2019-10-04 DIAGNOSIS — R262 Difficulty in walking, not elsewhere classified: Secondary | ICD-10-CM

## 2019-10-04 DIAGNOSIS — R600 Localized edema: Secondary | ICD-10-CM

## 2019-10-04 DIAGNOSIS — M6281 Muscle weakness (generalized): Secondary | ICD-10-CM
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
## 2019-10-08 ENCOUNTER — Other Ambulatory Visit: Payer: Self-pay

## 2019-10-08 ENCOUNTER — Ambulatory Visit: Payer: Medicaid Other

## 2019-10-08 DIAGNOSIS — M25561 Pain in right knee: Secondary | ICD-10-CM | POA: Diagnosis not present

## 2019-10-08 DIAGNOSIS — M6281 Muscle weakness (generalized): Secondary | ICD-10-CM

## 2019-10-08 DIAGNOSIS — R262 Difficulty in walking, not elsewhere classified: Secondary | ICD-10-CM

## 2019-10-08 DIAGNOSIS — G8929 Other chronic pain: Secondary | ICD-10-CM

## 2019-10-08 DIAGNOSIS — R600 Localized edema: Secondary | ICD-10-CM

## 2019-10-08 NOTE — Patient Instructions (Signed)
To focus on his knee flexion esp. the sitting scoot stretch and complete 4 to 6x daily.
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
## 2019-10-10 ENCOUNTER — Other Ambulatory Visit: Payer: Self-pay

## 2019-10-10 ENCOUNTER — Ambulatory Visit: Payer: Medicaid Other

## 2019-10-10 DIAGNOSIS — G8929 Other chronic pain: Secondary | ICD-10-CM

## 2019-10-10 DIAGNOSIS — M25561 Pain in right knee: Secondary | ICD-10-CM | POA: Diagnosis not present

## 2019-10-10 DIAGNOSIS — R262 Difficulty in walking, not elsewhere classified: Secondary | ICD-10-CM

## 2019-10-10 DIAGNOSIS — M6281 Muscle weakness (generalized): Secondary | ICD-10-CM

## 2019-10-10 DIAGNOSIS — R600 Localized edema: Secondary | ICD-10-CM

## 2019-10-10 NOTE — Patient Instructions (Signed)
2 chair quad set f/b hamstring stretch c UE reaching.
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
## 2019-10-15 ENCOUNTER — Ambulatory Visit: Payer: Medicaid Other

## 2019-10-15 ENCOUNTER — Other Ambulatory Visit: Payer: Self-pay

## 2019-10-15 DIAGNOSIS — R600 Localized edema: Secondary | ICD-10-CM

## 2019-10-15 DIAGNOSIS — R262 Difficulty in walking, not elsewhere classified: Secondary | ICD-10-CM

## 2019-10-15 DIAGNOSIS — G8929 Other chronic pain: Secondary | ICD-10-CM

## 2019-10-15 DIAGNOSIS — M6281 Muscle weakness (generalized): Secondary | ICD-10-CM

## 2019-10-15 DIAGNOSIS — M25561 Pain in right knee: Secondary | ICD-10-CM | POA: Diagnosis not present
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
## 2019-10-17 ENCOUNTER — Other Ambulatory Visit: Payer: Self-pay

## 2019-10-17 ENCOUNTER — Ambulatory Visit: Payer: Medicaid Other

## 2019-10-17 DIAGNOSIS — M25561 Pain in right knee: Secondary | ICD-10-CM | POA: Diagnosis not present

## 2019-10-17 DIAGNOSIS — M6281 Muscle weakness (generalized): Secondary | ICD-10-CM

## 2019-10-17 DIAGNOSIS — G8929 Other chronic pain: Secondary | ICD-10-CM

## 2019-10-17 DIAGNOSIS — R600 Localized edema: Secondary | ICD-10-CM

## 2019-10-17 DIAGNOSIS — R262 Difficulty in walking, not elsewhere classified: Secondary | ICD-10-CM
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
## 2019-10-22 ENCOUNTER — Other Ambulatory Visit: Payer: Self-pay

## 2019-10-22 ENCOUNTER — Ambulatory Visit: Payer: Medicaid Other

## 2019-10-22 DIAGNOSIS — R262 Difficulty in walking, not elsewhere classified: Secondary | ICD-10-CM

## 2019-10-22 DIAGNOSIS — G8929 Other chronic pain: Secondary | ICD-10-CM

## 2019-10-22 DIAGNOSIS — R600 Localized edema: Secondary | ICD-10-CM

## 2019-10-22 DIAGNOSIS — M25561 Pain in right knee: Secondary | ICD-10-CM

## 2019-10-22 DIAGNOSIS — M6281 Muscle weakness (generalized): Secondary | ICD-10-CM
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
## 2019-10-24 ENCOUNTER — Other Ambulatory Visit: Payer: Self-pay

## 2019-10-24 ENCOUNTER — Ambulatory Visit: Payer: Medicaid Other

## 2019-10-24 DIAGNOSIS — G8929 Other chronic pain: Secondary | ICD-10-CM

## 2019-10-24 DIAGNOSIS — M25561 Pain in right knee: Secondary | ICD-10-CM

## 2019-10-24 DIAGNOSIS — M6281 Muscle weakness (generalized): Secondary | ICD-10-CM

## 2019-10-24 DIAGNOSIS — R262 Difficulty in walking, not elsewhere classified: Secondary | ICD-10-CM

## 2019-10-24 DIAGNOSIS — R600 Localized edema: Secondary | ICD-10-CM
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
## 2019-10-29 ENCOUNTER — Other Ambulatory Visit: Payer: Self-pay | Admitting: Orthopaedic Surgery

## 2019-10-29 ENCOUNTER — Other Ambulatory Visit: Payer: Self-pay

## 2019-10-29 ENCOUNTER — Ambulatory Visit: Payer: Medicaid Other | Attending: Physician Assistant

## 2019-10-29 DIAGNOSIS — R262 Difficulty in walking, not elsewhere classified: Secondary | ICD-10-CM | POA: Diagnosis present

## 2019-10-29 DIAGNOSIS — M25561 Pain in right knee: Secondary | ICD-10-CM | POA: Insufficient documentation

## 2019-10-29 DIAGNOSIS — R600 Localized edema: Secondary | ICD-10-CM | POA: Insufficient documentation

## 2019-10-29 DIAGNOSIS — M6281 Muscle weakness (generalized): Secondary | ICD-10-CM

## 2019-10-29 DIAGNOSIS — G8929 Other chronic pain: Secondary | ICD-10-CM
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
## 2019-10-31 ENCOUNTER — Ambulatory Visit: Payer: Medicaid Other

## 2019-11-05 ENCOUNTER — Ambulatory Visit: Payer: Medicaid Other

## 2019-11-05 ENCOUNTER — Other Ambulatory Visit: Payer: Self-pay

## 2019-11-05 DIAGNOSIS — R262 Difficulty in walking, not elsewhere classified: Secondary | ICD-10-CM

## 2019-11-05 DIAGNOSIS — M6281 Muscle weakness (generalized): Secondary | ICD-10-CM

## 2019-11-05 DIAGNOSIS — M25561 Pain in right knee: Secondary | ICD-10-CM

## 2019-11-05 DIAGNOSIS — R600 Localized edema: Secondary | ICD-10-CM

## 2019-11-05 DIAGNOSIS — G8929 Other chronic pain: Secondary | ICD-10-CM
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
## 2019-11-07 ENCOUNTER — Other Ambulatory Visit: Payer: Self-pay

## 2019-11-07 ENCOUNTER — Ambulatory Visit: Payer: Medicaid Other

## 2019-11-07 DIAGNOSIS — G8929 Other chronic pain: Secondary | ICD-10-CM

## 2019-11-07 DIAGNOSIS — M6281 Muscle weakness (generalized): Secondary | ICD-10-CM

## 2019-11-07 DIAGNOSIS — R600 Localized edema: Secondary | ICD-10-CM

## 2019-11-07 DIAGNOSIS — M25561 Pain in right knee: Secondary | ICD-10-CM | POA: Diagnosis not present

## 2019-11-07 DIAGNOSIS — R262 Difficulty in walking, not elsewhere classified: Secondary | ICD-10-CM
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
## 2019-11-14 ENCOUNTER — Other Ambulatory Visit: Payer: Self-pay

## 2019-11-14 ENCOUNTER — Ambulatory Visit: Payer: Medicaid Other

## 2019-11-14 DIAGNOSIS — M6281 Muscle weakness (generalized): Secondary | ICD-10-CM

## 2019-11-14 DIAGNOSIS — G8929 Other chronic pain: Secondary | ICD-10-CM

## 2019-11-14 DIAGNOSIS — M25561 Pain in right knee: Secondary | ICD-10-CM

## 2019-11-14 DIAGNOSIS — R262 Difficulty in walking, not elsewhere classified: Secondary | ICD-10-CM

## 2019-11-14 DIAGNOSIS — R600 Localized edema: Secondary | ICD-10-CM
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
## 2019-11-20 ENCOUNTER — Ambulatory Visit: Payer: Medicaid Other

## 2019-11-20 ENCOUNTER — Other Ambulatory Visit: Payer: Self-pay

## 2019-11-20 DIAGNOSIS — G8929 Other chronic pain: Secondary | ICD-10-CM

## 2019-11-20 DIAGNOSIS — M25561 Pain in right knee: Secondary | ICD-10-CM | POA: Diagnosis not present

## 2019-11-20 DIAGNOSIS — M6281 Muscle weakness (generalized): Secondary | ICD-10-CM

## 2019-11-20 DIAGNOSIS — R262 Difficulty in walking, not elsewhere classified: Secondary | ICD-10-CM

## 2019-11-20 DIAGNOSIS — R600 Localized edema: Secondary | ICD-10-CM
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
## 2019-11-22 ENCOUNTER — Other Ambulatory Visit: Payer: Self-pay

## 2019-11-22 ENCOUNTER — Ambulatory Visit: Payer: Medicaid Other

## 2019-11-22 DIAGNOSIS — M25561 Pain in right knee: Secondary | ICD-10-CM | POA: Diagnosis not present

## 2019-11-22 DIAGNOSIS — R600 Localized edema: Secondary | ICD-10-CM

## 2019-11-22 DIAGNOSIS — R262 Difficulty in walking, not elsewhere classified: Secondary | ICD-10-CM

## 2019-11-22 DIAGNOSIS — M6281 Muscle weakness (generalized): Secondary | ICD-10-CM
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
## 2019-11-26 ENCOUNTER — Other Ambulatory Visit: Payer: Self-pay

## 2019-11-26 ENCOUNTER — Ambulatory Visit: Payer: Medicaid Other

## 2019-11-26 DIAGNOSIS — M25561 Pain in right knee: Secondary | ICD-10-CM | POA: Diagnosis not present

## 2019-11-26 DIAGNOSIS — R600 Localized edema: Secondary | ICD-10-CM

## 2019-11-26 DIAGNOSIS — M6281 Muscle weakness (generalized): Secondary | ICD-10-CM

## 2019-11-26 DIAGNOSIS — G8929 Other chronic pain: Secondary | ICD-10-CM

## 2019-11-26 DIAGNOSIS — R262 Difficulty in walking, not elsewhere classified: Secondary | ICD-10-CM
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
## 2019-12-05 ENCOUNTER — Ambulatory Visit: Payer: Medicaid Other

## 2020-04-23 ENCOUNTER — Emergency Department (HOSPITAL_COMMUNITY): Payer: Medicaid Other

## 2020-04-23 ENCOUNTER — Other Ambulatory Visit: Payer: Self-pay

## 2020-04-23 ENCOUNTER — Emergency Department (HOSPITAL_COMMUNITY)
Admission: EM | Admit: 2020-04-23 | Discharge: 2020-04-23 | Disposition: A | Discharge status: Home / Self Care | Payer: Medicaid Other | Attending: Emergency Medicine | Admitting: Emergency Medicine

## 2020-04-23 ENCOUNTER — Encounter (HOSPITAL_COMMUNITY): Payer: Self-pay | Admitting: *Deleted

## 2020-04-23 DIAGNOSIS — Z96651 Presence of right artificial knee joint: Secondary | ICD-10-CM | POA: Insufficient documentation

## 2020-04-23 DIAGNOSIS — S82832A Other fracture of upper and lower end of left fibula, initial encounter for closed fracture: Secondary | ICD-10-CM

## 2020-04-23 DIAGNOSIS — S80212A Abrasion, left knee, initial encounter: Secondary | ICD-10-CM | POA: Diagnosis not present

## 2020-04-23 DIAGNOSIS — Y93K1 Activity, walking an animal: Secondary | ICD-10-CM | POA: Insufficient documentation

## 2020-04-23 DIAGNOSIS — W010XXA Fall on same level from slipping, tripping and stumbling without subsequent striking against object, initial encounter: Secondary | ICD-10-CM | POA: Insufficient documentation

## 2020-04-23 DIAGNOSIS — X501XXA Overexertion from prolonged static or awkward postures, initial encounter: Secondary | ICD-10-CM | POA: Diagnosis not present

## 2020-04-23 DIAGNOSIS — M25572 Pain in left ankle and joints of left foot: Secondary | ICD-10-CM | POA: Insufficient documentation

## 2020-04-23 DIAGNOSIS — W19XXXA Unspecified fall, initial encounter: Secondary | ICD-10-CM

## 2020-04-23 DIAGNOSIS — S99912A Unspecified injury of left ankle, initial encounter: Secondary | ICD-10-CM | POA: Diagnosis present

## 2020-04-23 MED ORDER — OXYCODONE-ACETAMINOPHEN 5-325 MG PO TABS: 1.0000 | ORAL_TABLET | Freq: Four times a day (QID) | ORAL | 0 refills | Status: DC | PRN
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
## 2020-04-23 NOTE — Discharge Instructions (Signed)
Please take Tylenol (acetaminophen) to relieve your pain.  You may take tylenol, up to 1,000 mg (two extra strength pills).  Do not take more than 3,000 mg tylenol in a 24 hour period.  Please check all medication labels as many medications such as pain and cold medications may contain tylenol. Please do not drink alcohol while taking this medication.  Your pain medication has tylenol in it.  If you are taking the pain medication you need to reduce the amount of tylenol you are taking by 325 mg.   Please do not put weight on your broken leg.  Please wear the boot at all times even when you are sleeping.  If you must you may take the boot off for showering and hygiene activities, however it is very important that you do not put weight on your leg especially without the boot on as you can worsen your fractures and the displacement of your foot.

## 2020-04-23 NOTE — ED Triage Notes (Signed)
Pt was walking dog and slipped on leaves.  He heard a pop in his L ankle and broke his fall with his L knee.  Unable to bear weight since fall this am.

## 2020-04-23 NOTE — Progress Notes (Signed)
Orthopedic Tech Progress Note Patient Details:  Jason Hamilton February 19, 1956 [id number]  Ortho Devices Type of Ortho Device: CAM walker Ortho Device/Splint Location: LLE Ortho Device/Splint Interventions: Application, Ordered, Adjustment   Post Interventions Patient Tolerated: Well   [name] [name] 04/23/2020, 12:47 PM

## 2020-04-27 ENCOUNTER — Other Ambulatory Visit (HOSPITAL_COMMUNITY): Payer: Self-pay | Admitting: Orthopedic Surgery

## 2020-04-28 ENCOUNTER — Other Ambulatory Visit: Payer: Self-pay

## 2020-04-28 ENCOUNTER — Other Ambulatory Visit (HOSPITAL_COMMUNITY)
Admission: RE | Admit: 2020-04-28 | Discharge: 2020-04-28 | Disposition: A | Discharge status: Home / Self Care | Payer: Medicaid Other | Source: Ambulatory Visit | Attending: Orthopedic Surgery | Admitting: Orthopedic Surgery

## 2020-04-28 ENCOUNTER — Encounter (HOSPITAL_BASED_OUTPATIENT_CLINIC_OR_DEPARTMENT_OTHER): Payer: Self-pay | Admitting: Orthopedic Surgery

## 2020-04-28 DIAGNOSIS — Z01812 Encounter for preprocedural laboratory examination: Secondary | ICD-10-CM | POA: Insufficient documentation

## 2020-04-28 DIAGNOSIS — Z20822 Contact with and (suspected) exposure to covid-19: Secondary | ICD-10-CM | POA: Insufficient documentation

## 2020-04-28 LAB — SARS CORONAVIRUS 2 (TAT 6-24 HRS): SARS Coronavirus 2: NEGATIVE

## 2020-04-30 ENCOUNTER — Encounter (HOSPITAL_BASED_OUTPATIENT_CLINIC_OR_DEPARTMENT_OTHER): Payer: Self-pay | Admitting: Orthopedic Surgery

## 2020-04-30 ENCOUNTER — Ambulatory Visit (HOSPITAL_BASED_OUTPATIENT_CLINIC_OR_DEPARTMENT_OTHER): Payer: Medicaid Other | Admitting: Certified Registered"

## 2020-04-30 ENCOUNTER — Ambulatory Visit (HOSPITAL_BASED_OUTPATIENT_CLINIC_OR_DEPARTMENT_OTHER)
Admission: RE | Admit: 2020-04-30 | Discharge: 2020-04-30 | Disposition: A | Discharge status: Home / Self Care | Payer: Medicaid Other | Attending: Orthopedic Surgery | Admitting: Orthopedic Surgery

## 2020-04-30 ENCOUNTER — Other Ambulatory Visit: Payer: Self-pay

## 2020-04-30 ENCOUNTER — Encounter (HOSPITAL_BASED_OUTPATIENT_CLINIC_OR_DEPARTMENT_OTHER)
Admission: RE | Disposition: A | Discharge status: Home / Self Care | Payer: Self-pay | Source: Home / Self Care | Attending: Orthopedic Surgery

## 2020-04-30 DIAGNOSIS — K219 Gastro-esophageal reflux disease without esophagitis: Secondary | ICD-10-CM | POA: Insufficient documentation

## 2020-04-30 DIAGNOSIS — S82842A Displaced bimalleolar fracture of left lower leg, initial encounter for closed fracture: Secondary | ICD-10-CM | POA: Insufficient documentation

## 2020-04-30 DIAGNOSIS — Z6841 Body Mass Index (BMI) 40.0 and over, adult: Secondary | ICD-10-CM | POA: Diagnosis not present

## 2020-04-30 DIAGNOSIS — M1990 Unspecified osteoarthritis, unspecified site: Secondary | ICD-10-CM | POA: Insufficient documentation

## 2020-04-30 DIAGNOSIS — Y93K1 Activity, walking an animal: Secondary | ICD-10-CM | POA: Insufficient documentation

## 2020-04-30 DIAGNOSIS — M109 Gout, unspecified: Secondary | ICD-10-CM | POA: Insufficient documentation

## 2020-04-30 DIAGNOSIS — Z79899 Other long term (current) drug therapy: Secondary | ICD-10-CM | POA: Insufficient documentation

## 2020-04-30 DIAGNOSIS — E669 Obesity, unspecified: Secondary | ICD-10-CM | POA: Diagnosis not present

## 2020-04-30 DIAGNOSIS — Z7982 Long term (current) use of aspirin: Secondary | ICD-10-CM | POA: Diagnosis not present

## 2020-04-30 DIAGNOSIS — W010XXA Fall on same level from slipping, tripping and stumbling without subsequent striking against object, initial encounter: Secondary | ICD-10-CM | POA: Diagnosis not present

## 2020-04-30 HISTORY — DX: Displaced bimalleolar fracture of left lower leg, initial encounter for closed fracture: S82.842A

## 2020-04-30 HISTORY — PX: ORIF ANKLE FRACTURE: SHX5408

## 2020-04-30 SURGERY — OPEN REDUCTION INTERNAL FIXATION (ORIF) ANKLE FRACTURE
Anesthesia: Regional | Site: Ankle | Laterality: Left

## 2020-04-30 MED ORDER — OXYCODONE HCL 5 MG PO TABS: 5.0000 mg | ORAL_TABLET | Freq: Once | ORAL | Status: DC | PRN

## 2020-04-30 MED ORDER — ROPIVACAINE HCL 5 MG/ML IJ SOLN
INTRAMUSCULAR | Status: DC | PRN
  Administered 2020-04-30: 30 mL via PERINEURAL

## 2020-04-30 MED ORDER — CEFAZOLIN SODIUM-DEXTROSE 2-4 GM/100ML-% IV SOLN
2.0000 g | INTRAVENOUS | Status: AC
  Administered 2020-04-30: 2 g via INTRAVENOUS

## 2020-04-30 MED ORDER — FENTANYL CITRATE (PF) 100 MCG/2ML IJ SOLN
25.0000 ug | INTRAMUSCULAR | Status: DC | PRN
  Administered 2020-04-30 (×2): 50 ug via INTRAVENOUS

## 2020-04-30 MED ORDER — ONDANSETRON HCL 4 MG/2ML IJ SOLN: 4.0000 mg | Freq: Once | INTRAMUSCULAR | Status: DC | PRN

## 2020-04-30 MED ORDER — FENTANYL CITRATE (PF) 100 MCG/2ML IJ SOLN
INTRAMUSCULAR | Status: AC
  Filled 2020-04-30: qty 2

## 2020-04-30 MED ORDER — DEXAMETHASONE SODIUM PHOSPHATE 10 MG/ML IJ SOLN
INTRAMUSCULAR | Status: AC
  Filled 2020-04-30: qty 1

## 2020-04-30 MED ORDER — EPHEDRINE 5 MG/ML INJ
INTRAVENOUS | Status: AC
  Filled 2020-04-30: qty 50

## 2020-04-30 MED ORDER — VANCOMYCIN HCL 500 MG IV SOLR
INTRAVENOUS | Status: AC
  Filled 2020-04-30: qty 500

## 2020-04-30 MED ORDER — PROPOFOL 10 MG/ML IV BOLUS
INTRAVENOUS | Status: AC
  Filled 2020-04-30: qty 40

## 2020-04-30 MED ORDER — OXYCODONE HCL 5 MG PO TABS: 5.0000 mg | ORAL_TABLET | Freq: Four times a day (QID) | ORAL | 0 refills | Status: AC | PRN

## 2020-04-30 MED ORDER — ONDANSETRON HCL 4 MG/2ML IJ SOLN
INTRAMUSCULAR | Status: DC | PRN
  Administered 2020-04-30: 4 mg via INTRAVENOUS

## 2020-04-30 MED ORDER — CEFAZOLIN SODIUM-DEXTROSE 2-4 GM/100ML-% IV SOLN
INTRAVENOUS | Status: AC
  Filled 2020-04-30: qty 100

## 2020-04-30 MED ORDER — VANCOMYCIN HCL 500 MG IV SOLR
INTRAVENOUS | Status: DC | PRN
  Administered 2020-04-30: 500 mg via TOPICAL

## 2020-04-30 MED ORDER — OXYCODONE HCL 5 MG/5ML PO SOLN: 5.0000 mg | Freq: Once | ORAL | Status: DC | PRN

## 2020-04-30 MED ORDER — FENTANYL CITRATE (PF) 100 MCG/2ML IJ SOLN
INTRAMUSCULAR | Status: DC | PRN
  Administered 2020-04-30: 50 ug via INTRAVENOUS

## 2020-04-30 MED ORDER — DEXAMETHASONE SODIUM PHOSPHATE 10 MG/ML IJ SOLN
INTRAMUSCULAR | Status: DC | PRN
  Administered 2020-04-30: 10 mg via INTRAVENOUS

## 2020-04-30 MED ORDER — MIDAZOLAM HCL 2 MG/2ML IJ SOLN
INTRAMUSCULAR | Status: AC
  Filled 2020-04-30: qty 2

## 2020-04-30 MED ORDER — EPHEDRINE SULFATE 50 MG/ML IJ SOLN
INTRAMUSCULAR | Status: DC | PRN
  Administered 2020-04-30: 10 mg via INTRAVENOUS

## 2020-04-30 MED ORDER — LIDOCAINE 2% (20 MG/ML) 5 ML SYRINGE
INTRAMUSCULAR | Status: DC | PRN
  Administered 2020-04-30: 100 mg via INTRAVENOUS

## 2020-04-30 MED ORDER — ACETAMINOPHEN 500 MG PO TABS
1000.0000 mg | ORAL_TABLET | Freq: Once | ORAL | Status: AC
  Administered 2020-04-30: 1000 mg via ORAL

## 2020-04-30 MED ORDER — MIDAZOLAM HCL 2 MG/2ML IJ SOLN
2.0000 mg | Freq: Once | INTRAMUSCULAR | Status: AC
  Administered 2020-04-30: 2 mg via INTRAVENOUS

## 2020-04-30 MED ORDER — PROPOFOL 10 MG/ML IV BOLUS
INTRAVENOUS | Status: DC | PRN
  Administered 2020-04-30: 200 mg via INTRAVENOUS

## 2020-04-30 MED ORDER — FENTANYL CITRATE (PF) 100 MCG/2ML IJ SOLN
100.0000 ug | Freq: Once | INTRAMUSCULAR | Status: AC
  Administered 2020-04-30: 50 ug via INTRAVENOUS

## 2020-04-30 MED ORDER — 0.9 % SODIUM CHLORIDE (POUR BTL) OPTIME
TOPICAL | Status: DC | PRN
  Administered 2020-04-30: 200 mL

## 2020-04-30 MED ORDER — LACTATED RINGERS IV SOLN: INTRAVENOUS | Status: DC

## 2020-04-30 MED ORDER — ACETAMINOPHEN 500 MG PO TABS
ORAL_TABLET | ORAL | Status: AC
  Filled 2020-04-30: qty 2

## 2020-04-30 MED ORDER — SODIUM CHLORIDE 0.9 % IV SOLN: INTRAVENOUS | Status: DC

## 2020-04-30 SURGICAL SUPPLY — 78 items
APL PRP STRL LF DISP 70% ISPRP (MISCELLANEOUS) ×1
BANDAGE ESMARK 6X9 LF (GAUZE/BANDAGES/DRESSINGS) IMPLANT
BIT DRILL 2.5X2.75 QC CALB (BIT) ×2 IMPLANT
BIT DRILL 3.5X5.5 QC CALB (BIT) ×2 IMPLANT
BLADE SURG 15 STRL LF DISP TIS (BLADE) ×2 IMPLANT
BLADE SURG 15 STRL SS (BLADE) ×4
BNDG CMPR 9X4 STRL LF SNTH (GAUZE/BANDAGES/DRESSINGS)
BNDG CMPR 9X6 STRL LF SNTH (GAUZE/BANDAGES/DRESSINGS)
BNDG COHESIVE 4X5 TAN STRL (GAUZE/BANDAGES/DRESSINGS) ×2 IMPLANT
BNDG COHESIVE 6X5 TAN STRL LF (GAUZE/BANDAGES/DRESSINGS) ×2 IMPLANT
BNDG ESMARK 4X9 LF (GAUZE/BANDAGES/DRESSINGS) IMPLANT
BNDG ESMARK 6X9 LF (GAUZE/BANDAGES/DRESSINGS)
CANISTER SUCT 1200ML W/VALVE (MISCELLANEOUS) ×2 IMPLANT
CHLORAPREP W/TINT 26 (MISCELLANEOUS) ×2 IMPLANT
COVER BACK TABLE 60X90IN (DRAPES) ×2 IMPLANT
COVER WAND RF STERILE (DRAPES) IMPLANT
CUFF TOURN SGL QUICK 34 (TOURNIQUET CUFF) ×2
CUFF TRNQT CYL 34X4.125X (TOURNIQUET CUFF) ×1 IMPLANT
DECANTER SPIKE VIAL GLASS SM (MISCELLANEOUS) IMPLANT
DRAPE EXTREMITY T 121X128X90 (DISPOSABLE) ×2 IMPLANT
DRAPE OEC MINIVIEW 54X84 (DRAPES) ×2 IMPLANT
DRAPE U-SHAPE 47X51 STRL (DRAPES) ×2 IMPLANT
DRSG MEPITEL 4X7.2 (GAUZE/BANDAGES/DRESSINGS) ×2 IMPLANT
DRSG PAD ABDOMINAL 8X10 ST (GAUZE/BANDAGES/DRESSINGS) ×4 IMPLANT
ELECT REM PT RETURN 9FT ADLT (ELECTROSURGICAL) ×2
ELECTRODE REM PT RTRN 9FT ADLT (ELECTROSURGICAL) ×1 IMPLANT
GAUZE SPONGE 4X4 12PLY STRL (GAUZE/BANDAGES/DRESSINGS) ×2 IMPLANT
GLOVE BIO SURGEON STRL SZ8 (GLOVE) ×2 IMPLANT
GLOVE BIOGEL PI IND STRL 8 (GLOVE) ×2 IMPLANT
GLOVE BIOGEL PI INDICATOR 8 (GLOVE) ×2
GLOVE ECLIPSE 8.0 STRL XLNG CF (GLOVE) ×2 IMPLANT
GLOVE SURG SS PI 7.0 STRL IVOR (GLOVE) ×2 IMPLANT
GLOVE SURG UNDER POLY LF SZ7 (GLOVE) ×4 IMPLANT
GOWN STRL REUS W/ TWL LRG LVL3 (GOWN DISPOSABLE) ×1 IMPLANT
GOWN STRL REUS W/ TWL XL LVL3 (GOWN DISPOSABLE) ×2 IMPLANT
GOWN STRL REUS W/TWL LRG LVL3 (GOWN DISPOSABLE) ×2
GOWN STRL REUS W/TWL XL LVL3 (GOWN DISPOSABLE) ×4
NEEDLE HYPO 22GX1.5 SAFETY (NEEDLE) IMPLANT
NS IRRIG 1000ML POUR BTL (IV SOLUTION) ×2 IMPLANT
PACK BASIN DAY SURGERY FS (CUSTOM PROCEDURE TRAY) ×2 IMPLANT
PAD CAST 3X4 CTTN HI CHSV (CAST SUPPLIES) ×2 IMPLANT
PAD CAST 4YDX4 CTTN HI CHSV (CAST SUPPLIES) ×1 IMPLANT
PADDING CAST ABS 4INX4YD NS (CAST SUPPLIES)
PADDING CAST ABS COTTON 4X4 ST (CAST SUPPLIES) IMPLANT
PADDING CAST COTTON 3X4 STRL (CAST SUPPLIES) ×4
PADDING CAST COTTON 4X4 STRL (CAST SUPPLIES) ×2
PADDING CAST COTTON 6X4 STRL (CAST SUPPLIES) ×2 IMPLANT
PENCIL SMOKE EVACUATOR (MISCELLANEOUS) ×2 IMPLANT
PLATE ACE 100DEG 6HOLE (Plate) ×2 IMPLANT
SANITIZER HAND PURELL 535ML FO (MISCELLANEOUS) ×2 IMPLANT
SCREW CORTICAL 3.5MM  16MM (Screw) ×6 IMPLANT
SCREW CORTICAL 3.5MM  20MM (Screw) ×4 IMPLANT
SCREW CORTICAL 3.5MM  30MM (Screw) ×2 IMPLANT
SCREW CORTICAL 3.5MM 16MM (Screw) ×3 IMPLANT
SCREW CORTICAL 3.5MM 18MM (Screw) ×2 IMPLANT
SCREW CORTICAL 3.5MM 20MM (Screw) ×2 IMPLANT
SCREW CORTICAL 3.5MM 30MM (Screw) ×1 IMPLANT
SHEET MEDIUM DRAPE 40X70 STRL (DRAPES) ×2 IMPLANT
SLEEVE SCD COMPRESS KNEE MED (MISCELLANEOUS) ×2 IMPLANT
SPLINT FAST PLASTER 5X30 (CAST SUPPLIES) ×20
SPLINT PLASTER CAST FAST 5X30 (CAST SUPPLIES) ×20 IMPLANT
SPONGE LAP 18X18 RF (DISPOSABLE) ×2 IMPLANT
STOCKINETTE 6  STRL (DRAPES) ×2
STOCKINETTE 6 STRL (DRAPES) ×1 IMPLANT
SUCTION FRAZIER HANDLE 10FR (MISCELLANEOUS) ×2
SUCTION TUBE FRAZIER 10FR DISP (MISCELLANEOUS) ×1 IMPLANT
SUT ETHILON 3 0 PS 1 (SUTURE) ×2 IMPLANT
SUT FIBERWIRE #2 38 T-5 BLUE (SUTURE)
SUT MNCRL AB 3-0 PS2 18 (SUTURE) IMPLANT
SUT VIC AB 0 SH 27 (SUTURE) IMPLANT
SUT VIC AB 2-0 SH 27 (SUTURE) ×2
SUT VIC AB 2-0 SH 27XBRD (SUTURE) ×1 IMPLANT
SUTURE FIBERWR #2 38 T-5 BLUE (SUTURE) IMPLANT
SYR BULB EAR ULCER 3OZ GRN STR (SYRINGE) ×2 IMPLANT
SYR CONTROL 10ML LL (SYRINGE) IMPLANT
TOWEL GREEN STERILE FF (TOWEL DISPOSABLE) ×4 IMPLANT
TUBE CONNECTING 20X1/4 (TUBING) ×2 IMPLANT
UNDERPAD 30X36 HEAVY ABSORB (UNDERPADS AND DIAPERS) ×2 IMPLANT

## 2020-04-30 NOTE — Anesthesia Procedure Notes (Signed)
Procedure Name: LMA Insertion Date/Time: 04/30/2020 3:17 PM Performed by: Lauralyn Primes, CRNA Pre-anesthesia Checklist: Patient identified, Emergency Drugs available, Suction available and Patient being monitored Patient Re-evaluated:Patient Re-evaluated prior to induction Oxygen Delivery Method: Circle system utilized Preoxygenation: Pre-oxygenation with 100% oxygen Induction Type: IV induction Ventilation: Mask ventilation without difficulty LMA: LMA inserted LMA Size: 5.0 Number of attempts: 1 Airway Equipment and Method: Bite block Placement Confirmation: positive ETCO2 Tube secured with: Tape Dental Injury: Teeth and Oropharynx as per pre-operative assessment

## 2020-04-30 NOTE — Anesthesia Preprocedure Evaluation (Addendum)
Anesthesia Evaluation  Patient identified by MRN, date of birth, ID band Patient awake    Reviewed: Allergy & Precautions, NPO status , Patient's Chart, lab work & pertinent test results  Airway Mallampati: II  TM Distance: >3 FB Neck ROM: Full    Dental  (+) Dental Advisory Given   Pulmonary neg pulmonary ROS,    Pulmonary exam normal breath sounds clear to auscultation       Cardiovascular negative cardio ROS Normal cardiovascular exam Rhythm:Regular Rate:Normal     Neuro/Psych PSYCHIATRIC DISORDERS Depression negative neurological ROS     GI/Hepatic GERD  ,(+) Hepatitis - (treated), A  Endo/Other  negative endocrine ROS  Renal/GU negative Renal ROS     Musculoskeletal  (+) Arthritis ,   Abdominal (+) + obese,   Peds  Hematology negative hematology ROS (+)   Anesthesia Other Findings Ankle fracture  Reproductive/Obstetrics                             Anesthesia Physical  Anesthesia Plan  ASA: II  Anesthesia Plan: General and Regional   Post-op Pain Management: GA combined w/ Regional for post-op pain   Induction: Intravenous  PONV Risk Score and Plan: 2 and Ondansetron, Dexamethasone and Midazolam  Airway Management Planned: LMA  Additional Equipment:   Intra-op Plan:   Post-operative Plan: Extubation in OR  Informed Consent: I have reviewed the patients History and Physical, chart, labs and discussed the procedure including the risks, benefits and alternatives for the proposed anesthesia with the patient or authorized representative who has indicated his/her understanding and acceptance.     Dental advisory given  Plan Discussed with: CRNA and Anesthesiologist  Anesthesia Plan Comments:         Anesthesia Quick Evaluation

## 2020-04-30 NOTE — Anesthesia Procedure Notes (Signed)
Anesthesia Regional Block: Adductor canal block   Pre-Anesthetic Checklist: ,, timeout performed, Correct Patient, Correct Site, Correct Laterality, Correct Procedure, Correct Position, site marked, Risks and benefits discussed,  Surgical consent,  Pre-op evaluation,  At surgeon's request and post-op pain management  Laterality: Left  Prep: chloraprep       Needles:  Injection technique: Single-shot  Needle Type: Echogenic Stimulator Needle     Needle Length: 10cm  Needle Gauge: 20     Additional Needles:   Procedures:,,,, ultrasound used (permanent image in chart),,,,  Narrative:  Start time: 04/30/2020 1:30 PM End time: 04/30/2020 1:35 PM Injection made incrementally with aspirations every 5 mL.  Performed by: Personally  Anesthesiologist: Mellody Dance, MD  Additional Notes: A functioning IV was confirmed and monitors were applied.  Sterile prep and drape, hand hygiene and sterile gloves were used.  Negative aspiration and test dose prior to incremental administration of local anesthetic. The patient tolerated the procedure well.Ultrasound  guidance: relevant anatomy identified, needle position confirmed, local anesthetic spread visualized around nerve(s), vascular puncture avoided.  Image printed for medical record.

## 2020-04-30 NOTE — Discharge Instructions (Addendum)
Jason Hewitt, MD [facility]  Please read the following information regarding your care after surgery.  Medications  You only need a prescription for the narcotic pain medicine (ex. oxycodone, Percocet, Norco).  All of the other medicines listed below are available over the counter. X Aleve 2 pills twice a day for the first 3 days after surgery. X acetominophen (Tylenol) 650 mg every 4-6 hours as you need for minor to moderate pain X oxycodone as prescribed for severe pain  Narcotic pain medicine (ex. oxycodone, Percocet, Vicodin) will cause constipation.  To prevent this problem, take the following medicines while you are taking any pain medicine. X docusate sodium (Colace) 100 mg twice a day X senna (Senokot) 2 tablets twice a day  X To help prevent blood clots, take a baby aspirin (81 mg) twice a day for two weeks after surgery.  You should also get up every hour while you are awake to move around.    Weight Bearing ? Bear weight when you are able on your operated leg or foot. ? Bear weight only on your operated foot in the post-op shoe. X Do not bear any weight on the operated leg or foot.  Cast / Splint / Dressing X Keep your splint, cast or dressing clean and dry.  Don't put anything (coat hanger, pencil, etc) down inside of it.  If it gets damp, use a hair dryer on the cool setting to dry it.  If it gets soaked, call the office to schedule an appointment for a cast change. ? Remove your dressing 3 days after surgery and cover the incisions with dry dressings.    After your dressing, cast or splint is removed; you may shower, but do not soak or scrub the wound.  Allow the water to run over it, and then gently pat it dry.  Swelling It is normal for you to have swelling where you had surgery.  To reduce swelling and pain, keep your toes above your nose for at least 3 days after surgery.  It may be necessary to keep your foot or leg elevated for several weeks.  If it hurts, it should be  elevated.  Follow Up Call my office at [phone] when you are discharged from the hospital or surgery center to schedule an appointment to be seen two weeks after surgery.  Call my office at [phone] if you develop a fever >101.5 F, nausea, vomiting, bleeding from the surgical site or severe pain.     Post Anesthesia Home Care Instructions  Activity: Get plenty of rest for the remainder of the day. A responsible individual must stay with you for 24 hours following the procedure.  For the next 24 hours, DO NOT: -Drive a car -Operate machinery -Drink alcoholic beverages -Take any medication unless instructed by your physician -Make any legal decisions or sign important papers.  Meals: Start with liquid foods such as gelatin or soup. Progress to regular foods as tolerated. Avoid greasy, spicy, heavy foods. If nausea and/or vomiting occur, drink only clear liquids until the nausea and/or vomiting subsides. Call your physician if vomiting continues.  Special Instructions/Symptoms: Your throat may feel dry or sore from the anesthesia or the breathing tube placed in your throat during surgery. If this causes discomfort, gargle with warm salt water. The discomfort should disappear within 24 hours.  If you had a scopolamine patch placed behind your ear for the management of post- operative nausea and/or vomiting:  1. The medication in the patch is   effective for 72 hours, after which it should be removed.  Wrap patch in a tissue and discard in the trash. Wash hands thoroughly with soap and water. 2. You may remove the patch earlier than 72 hours if you experience unpleasant side effects which may include dry mouth, dizziness or visual disturbances. 3. Avoid touching the patch. Wash your hands with soap and water after contact with the patch.    Regional Anesthesia Blocks  1. Numbness or the inability to move the "blocked" extremity may last from 3-48 hours after placement. The length  of time depends on the medication injected and your individual response to the medication. If the numbness is not going away after 48 hours, call your surgeon.  2. The extremity that is blocked will need to be protected until the numbness is gone and the  Strength has returned. Because you cannot feel it, you will need to take extra care to avoid injury. Because it may be weak, you may have difficulty moving it or using it. You may not know what position it is in without looking at it while the block is in effect.  3. For blocks in the legs and feet, returning to weight bearing and walking needs to be done carefully. You will need to wait until the numbness is entirely gone and the strength has returned. You should be able to move your leg and foot normally before you try and bear weight or walk. You will need someone to be with you when you first try to ensure you do not fall and possibly risk injury.  4. Bruising and tenderness at the needle site are common side effects and will resolve in a few days.  5. Persistent numbness or new problems with movement should be communicated to the surgeon or the [facility] ([phone])/ [facility] ([phone]). 

## 2020-04-30 NOTE — Anesthesia Procedure Notes (Signed)
Anesthesia Regional Block: Popliteal block   Pre-Anesthetic Checklist: ,, timeout performed, Correct Patient, Correct Site, Correct Laterality, Correct Procedure, Correct Position, site marked, Risks and benefits discussed,  Surgical consent,  Pre-op evaluation,  At surgeon's request and post-op pain management  Laterality: Left  Prep: chloraprep       Needles:  Injection technique: Single-shot  Needle Type: Echogenic Stimulator Needle     Needle Length: 10cm  Needle Gauge: 20     Additional Needles:   Narrative:  Start time: 04/30/2020 1:30 PM End time: 04/30/2020 1:35 PM Injection made incrementally with aspirations every 5 mL.  Performed by: Personally  Anesthesiologist: Mellody Dance, MD  Additional Notes: A functioning IV was confirmed and monitors were applied.  Sterile prep and drape, hand hygiene and sterile gloves were used.  Negative aspiration and test dose prior to incremental administration of local anesthetic. The patient tolerated the procedure well.Ultrasound  guidance: relevant anatomy identified, needle position confirmed, local anesthetic spread visualized around nerve(s), vascular puncture avoided.  Image printed for medical record.
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
## 2020-04-30 NOTE — Transfer of Care (Signed)
Immediate Anesthesia Transfer of Care Note  Patient: Jason Hamilton  Procedure(s) Performed: Open Reduction Internal Fixation (ORIF) Left ankle bimalleolar fracture (Left Ankle)  Patient Location: PACU  Anesthesia Type:GA combined with regional for post-op pain  Level of Consciousness: drowsy  Airway & Oxygen Therapy: Patient Spontanous Breathing and Patient connected to face mask oxygen  Post-op Assessment: Report given to RN and Post -op Vital signs reviewed and stable  Post vital signs: Reviewed and stable  Last Vitals:  Vitals Value Taken Time  BP    Temp    Pulse    Resp    SpO2      Last Pain:  Vitals:   04/30/20 1246  PainSc: 0-No pain      Patients Stated Pain Goal: 3 (04/30/20 1246)  Complications: No complications documented.

## 2020-04-30 NOTE — Op Note (Signed)
04/30/2020  4:01 PM  PATIENT:  Jason Hamilton  64 y.o. male  PRE-OPERATIVE DIAGNOSIS:  left ankle bimalleolar fracture  POST-OPERATIVE DIAGNOSIS:  left ankle bimalleolar fracture  Procedure(s): 1.  Open treatment of left ankle bimalleolar fracture with internal fixation 2.  Stress examination of the left ankle under fluoroscopy 3.  AP, mortise and lateral radiographs of the left ankle  SURGEON:  Toni Arthurs, MD  ASSISTANT: None ANESTHESIA:   General, regional  EBL:  minimal   TOURNIQUET:   Total Tourniquet Time Documented: Thigh (Left) - 22 minutes Total: Thigh (Left) - 22 minutes  COMPLICATIONS:  None apparent  DISPOSITION:  Extubated, awake and stable to recovery.  INDICATION FOR PROCEDURE: The patient is a 64 year old male without significant past medical history.  He injured his left ankle a few days ago when he slipped on some leaves while walking his dog.  He sustained a bimalleolar fracture of the left ankle with widening of the ankle mortise.  He presents now for operative treatment of this displaced and unstable left ankle fracture.  The risks and benefits of the alternative treatment options have been discussed in detail.  The patient wishes to proceed with surgery and specifically understands risks of bleeding, infection, nerve damage, blood clots, need for additional surgery, amputation and death.  PROCEDURE IN DETAIL:  After pre operative consent was obtained, and the correct operative site was identified, the patient was brought to the operating room and placed supine on the OR table.  Anesthesia was administered.  Pre-operative antibiotics were administered.  A surgical timeout was taken.  The left lower extremity was prepped and draped in standard sterile fashion with a tourniquet around the thigh.  The extremity was elevated and the tourniquet was inflated to 250 mmHg.  A longitudinal incision was made over the lateral malleolus.  Dissection was carried down through  the subcutaneous tissues.  The fracture site was identified.  Was cleaned of all hematoma and irrigated.  Fracture was reduced and held provisionally with a tenaculum.  A 3.5 mm lag screw was inserted from posterior to anterior across the fracture site.  It was noted to have excellent purchase and compressed the fracture site appropriately.  A 6 hole one third tubular plate from the [employer] titanium small frag set was then selected.  It was contoured to fit the lateral malleolus.  It was secured proximally with 3 bicortical screws and distally with 3 unicortical screws.  AP, mortise and lateral radiographs of the left ankle showed appropriate position and length of all hardware.  Stress examination was then performed.  Dorsiflexion and external rotation stress was applied to the supinated forefoot under live fluoroscopy.  There was no widening of the ankle mortise or evidence of instability at the syndesmosis.  The medial malleolus fracture was quite small and was stable to stress testing.  It did not require internal fixation.  The lateral wound was then irrigated copiously and sprinkled with vancomycin powder.  Subcutaneous tissues were approximated with inverted simple sutures of 2-0 Vicryl.  The skin incision was closed with running 3-0 nylon.  Sterile dressings were applied followed by a well-padded short leg splint.  The tourniquet was released after application of the dressings.  The patient was awakened from anesthesia and transported to the recovery room in stable condition.   FOLLOW UP PLAN: Nonweightbearing on the left lower extremity.  Follow-up in the office in 2 weeks for suture movable and conversion to a cam boot to begin early  weightbearing and range of motion.  Aspirin for DVT prophylaxis.   RADIOGRAPHS: AP, mortise and lateral radiographs of the left ankle are obtained intraoperatively.  These show interval reduction and fixation of the lateral malleolus fracture.  Hardware is  appropriately positioned and of the appropriate lengths.  The medial malleolus avulsion fracture is appropriately reduced.  No other acute injuries are noted.

## 2020-04-30 NOTE — Progress Notes (Signed)
Assisted Dr. Donavan Foil with left, ultrasound guided, popliteal/saphenous, adductor canal block. Side rails up, monitors on throughout procedure. See vital signs in flow sheet. Tolerated Procedure well.

## 2020-05-01 ENCOUNTER — Encounter (HOSPITAL_BASED_OUTPATIENT_CLINIC_OR_DEPARTMENT_OTHER): Payer: Self-pay | Admitting: Orthopedic Surgery

## 2020-05-01 NOTE — Anesthesia Postprocedure Evaluation (Signed)
Anesthesia Post Note  Patient: Jason Hamilton  Procedure(s) Performed: Open Reduction Internal Fixation (ORIF) Left ankle bimalleolar fracture (Left Ankle)     Patient location during evaluation: PACU Anesthesia Type: Regional and General Level of consciousness: sedated Pain management: pain level controlled Vital Signs Assessment: post-procedure vital signs reviewed and stable Respiratory status: spontaneous breathing and respiratory function stable Cardiovascular status: stable Postop Assessment: no apparent nausea or vomiting Anesthetic complications: no   No complications documented.  Last Vitals:  Vitals:   04/30/20 1630 04/30/20 1650  BP: (!) 143/84 (!) 165/61  Pulse: 81 79  Resp: 16 16  Temp:  (!) 36.4 C  SpO2: 95% 100%    Last Pain:  Vitals:                 Mellody Dance

## 2020-06-20 ENCOUNTER — Other Ambulatory Visit: Payer: Self-pay

## 2020-06-20 ENCOUNTER — Ambulatory Visit (HOSPITAL_COMMUNITY)
Admission: EM | Admit: 2020-06-20 | Discharge: 2020-06-20 | Disposition: A | Discharge status: Home / Self Care | Payer: Medicaid Other | Attending: Internal Medicine | Admitting: Internal Medicine

## 2020-06-20 DIAGNOSIS — Z20822 Contact with and (suspected) exposure to covid-19: Secondary | ICD-10-CM | POA: Insufficient documentation

## 2020-06-20 DIAGNOSIS — Z1152 Encounter for screening for COVID-19: Secondary | ICD-10-CM | POA: Diagnosis not present

## 2020-06-20 LAB — SARS CORONAVIRUS 2 (TAT 6-24 HRS): SARS Coronavirus 2: NEGATIVE

## 2020-06-20 NOTE — Discharge Instructions (Signed)
If your Covid-19 test is positive, you will get a phone call from [facility] regarding your results. If your Covid-19 test is negative, you will NOT get a phone call from [facility] with your results. You may view your results on MyChart. If you do not have a MyChart account, sign up instructions are in your discharge papers. ° °

## 2020-06-20 NOTE — ED Triage Notes (Signed)
Pt presents for covid testing after possible exposure with no known symptoms.

## 2020-10-02 ENCOUNTER — Telehealth: Payer: Self-pay

## 2020-10-02 NOTE — Telephone Encounter (Signed)
NOTE  ON FILE FROM [facility] [phone], REFERRAL SENT TO SCHEDULING

## 2020-11-11 ENCOUNTER — Encounter (HOSPITAL_COMMUNITY): Payer: Self-pay

## 2020-11-11 ENCOUNTER — Other Ambulatory Visit: Payer: Self-pay

## 2020-11-11 ENCOUNTER — Ambulatory Visit (HOSPITAL_COMMUNITY)
Admission: EM | Admit: 2020-11-11 | Discharge: 2020-11-11 | Disposition: A | Discharge status: Home / Self Care | Payer: Medicare Other | Attending: Emergency Medicine | Admitting: Emergency Medicine

## 2020-11-11 DIAGNOSIS — R059 Cough, unspecified: Secondary | ICD-10-CM | POA: Diagnosis present

## 2020-11-11 DIAGNOSIS — U071 COVID-19: Secondary | ICD-10-CM | POA: Diagnosis not present

## 2020-11-11 DIAGNOSIS — J069 Acute upper respiratory infection, unspecified: Secondary | ICD-10-CM | POA: Insufficient documentation

## 2020-11-11 LAB — SARS CORONAVIRUS 2 (TAT 6-24 HRS): SARS Coronavirus 2: POSITIVE — AB

## 2020-11-11 NOTE — ED Triage Notes (Signed)
Pt c/o a dry cough last night. Pt states he has been exposed to a friend that is COVID positive. Pt denies pain and other sxs.
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
## 2020-11-11 NOTE — Discharge Instructions (Addendum)

## 2020-12-25 ENCOUNTER — Ambulatory Visit: Payer: Medicaid Other | Admitting: Internal Medicine
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
## 2020-12-31 ENCOUNTER — Encounter: Payer: Self-pay | Admitting: Internal Medicine

## 2020-12-31 ENCOUNTER — Other Ambulatory Visit (HOSPITAL_COMMUNITY): Payer: Medicaid Other

## 2020-12-31 ENCOUNTER — Other Ambulatory Visit: Payer: Self-pay

## 2020-12-31 ENCOUNTER — Ambulatory Visit: Payer: Medicaid Other | Admitting: Internal Medicine

## 2020-12-31 VITALS — BP 128/70 | HR 82 | Ht 67.5 in | Wt 266.0 lb

## 2020-12-31 DIAGNOSIS — I447 Left bundle-branch block, unspecified: Secondary | ICD-10-CM | POA: Diagnosis not present

## 2020-12-31 DIAGNOSIS — R011 Cardiac murmur, unspecified: Secondary | ICD-10-CM | POA: Diagnosis not present

## 2020-12-31 DIAGNOSIS — E7849 Other hyperlipidemia: Secondary | ICD-10-CM | POA: Diagnosis not present

## 2020-12-31 LAB — CBC
Hematocrit: 38 % (ref 37.5–51.0)
Hemoglobin: 12.9 g/dL — ABNORMAL LOW (ref 13.0–17.7)
MCH: 27.2 pg (ref 26.6–33.0)
MCHC: 33.9 g/dL (ref 31.5–35.7)
MCV: 80 fL (ref 79–97)
Platelets: 230 10*3/uL (ref 150–450)
RBC: 4.74 x10E6/uL (ref 4.14–5.80)
RDW: 12.5 % (ref 11.6–15.4)
WBC: 5 10*3/uL (ref 3.4–10.8)

## 2020-12-31 LAB — BASIC METABOLIC PANEL
BUN/Creatinine Ratio: 12 (ref 10–24)
BUN: 15 mg/dL (ref 8–27)
CO2: 20 mmol/L (ref 20–29)
Calcium: 9.4 mg/dL (ref 8.6–10.2)
Chloride: 101 mmol/L (ref 96–106)
Creatinine, Ser: 1.29 mg/dL — ABNORMAL HIGH (ref 0.76–1.27)
Glucose: 111 mg/dL — ABNORMAL HIGH (ref 65–99)
Potassium: 4.1 mmol/L (ref 3.5–5.2)
Sodium: 138 mmol/L (ref 134–144)
eGFR: 62 mL/min/{1.73_m2} (ref >59)

## 2020-12-31 LAB — LIPID PANEL
Chol/HDL Ratio: 3.2 ratio (ref 0.0–5.0)
Cholesterol, Total: 188 mg/dL (ref 100–199)
HDL: 59 mg/dL (ref >39)
LDL Chol Calc (NIH): 112 mg/dL — ABNORMAL HIGH (ref 0–99)
Triglycerides: 92 mg/dL (ref 0–149)
VLDL Cholesterol Cal: 17 mg/dL (ref 5–40)

## 2020-12-31 MED ORDER — ASPIRIN EC 81 MG PO TBEC: 81.0000 mg | DELAYED_RELEASE_TABLET | Freq: Every day | ORAL | 3 refills | Status: DC

## 2020-12-31 NOTE — Patient Instructions (Signed)
Medication Instructions:  Your physician has recommended you make the following change in your medication:  START:  Aspirin 81 mg by mouth daily  *If you need a refill on your cardiac medications before your next appointment, please call your pharmacy*   Lab Work: TODAY: CBC, BMP, FLP  If you have labs (blood work) drawn today and your tests are completely normal, you will receive your results only by: MyChart Message (if you have MyChart) OR A paper copy in the mail If you have any lab test that is abnormal or we need to change your treatment, we will call you to review the results.   Testing/Procedures: Your physician has requested that you have an echocardiogram. Echocardiography is a painless test that uses sound waves to create images of your heart. It provides your doctor with information about the size and shape of your heart and how well your heart's chambers and valves are working. This procedure takes approximately one hour. There are no restrictions for this procedure.    Follow-Up: At [facility], you and your health needs are our priority.  As part of our continuing mission to provide you with exceptional heart care, we have created designated Provider Care Teams.  These Care Teams include your primary Cardiologist (physician) and Advanced Practice Providers (APPs -  Physician Assistants and Nurse Practitioners) who all work together to provide you with the care you need, when you need it.  We recommend signing up for the patient portal called "MyChart".  Sign up information is provided on this After Visit Summary.  MyChart is used to connect with patients for Virtual Visits (Telemedicine).  Patients are able to view lab/test results, encounter notes, upcoming appointments, etc.  Non-urgent messages can be sent to your provider as well.   To learn more about what you can do with MyChart, go to [email or web address].    Your next appointment:   6-8 week(s)  The format  for your next appointment:   In Person  Provider:   You may see Riley Lam, MD or one of the following Advanced Practice Providers on your designated Care Team:   Ronie Spies, PA-C Jacolyn Reedy, PA-C

## 2021-01-05 ENCOUNTER — Ambulatory Visit (HOSPITAL_COMMUNITY): Payer: Medicare Other | Attending: Internal Medicine

## 2021-01-05 ENCOUNTER — Other Ambulatory Visit: Payer: Self-pay

## 2021-01-05 DIAGNOSIS — I447 Left bundle-branch block, unspecified: Secondary | ICD-10-CM | POA: Insufficient documentation

## 2021-01-05 DIAGNOSIS — R011 Cardiac murmur, unspecified: Secondary | ICD-10-CM | POA: Diagnosis present

## 2021-01-05 LAB — ECHOCARDIOGRAM COMPLETE
Area-P 1/2: 3.85 cm2
S' Lateral: 3.5 cm

## 2021-01-05 MED ORDER — PERFLUTREN LIPID MICROSPHERE
1.0000 mL | INTRAVENOUS | Status: AC | PRN
  Administered 2021-01-05: 2 mL via INTRAVENOUS

## 2021-01-06 ENCOUNTER — Telehealth: Payer: Self-pay

## 2021-01-06 DIAGNOSIS — E7849 Other hyperlipidemia: Secondary | ICD-10-CM

## 2021-01-06 DIAGNOSIS — I447 Left bundle-branch block, unspecified: Secondary | ICD-10-CM

## 2021-01-06 DIAGNOSIS — R011 Cardiac murmur, unspecified: Secondary | ICD-10-CM

## 2021-01-06 NOTE — Telephone Encounter (Signed)
-----   Message from Christell Constant, MD sent at 01/05/2021  4:35 PM EDT ----- Results: Normal LVEF Plan: Stress MRI  Christell Constant, MD

## 2021-01-06 NOTE — Telephone Encounter (Signed)
Order placed for Cardiac MRI.  I reviewed instructions with pt, he request that I send instructions in the mail as he was busy.  I have printed letter and will send to pt.

## 2021-01-11 NOTE — Addendum Note (Signed)
Addended by: Winifred Olive on: 01/11/2021 11:36 AM   Modules accepted: Orders

## 2021-02-12 ENCOUNTER — Ambulatory Visit: Payer: Medicaid Other | Admitting: Internal Medicine

## 2021-02-16 ENCOUNTER — Telehealth (HOSPITAL_COMMUNITY): Payer: Self-pay | Admitting: *Deleted

## 2021-02-16 NOTE — Telephone Encounter (Signed)
Attempted to call patient regarding upcoming cardiac MRI appointment. Left message on voicemail with name and callback number  Kasey Ewings RN Navigator Cardiac Imaging [facility] [phone] Office [phone] Cell  

## 2021-02-18 ENCOUNTER — Ambulatory Visit (HOSPITAL_COMMUNITY): Admission: RE | Admit: 2021-02-18 | Payer: Medicaid Other | Source: Ambulatory Visit
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
## 2021-02-23 ENCOUNTER — Encounter: Payer: Self-pay | Admitting: Internal Medicine

## 2021-02-23 ENCOUNTER — Other Ambulatory Visit: Payer: Self-pay

## 2021-02-23 ENCOUNTER — Ambulatory Visit (INDEPENDENT_AMBULATORY_CARE_PROVIDER_SITE_OTHER): Payer: Medicaid Other | Admitting: Internal Medicine

## 2021-02-23 VITALS — BP 128/68 | HR 69 | Ht 67.5 in | Wt 263.0 lb

## 2021-02-23 DIAGNOSIS — I447 Left bundle-branch block, unspecified: Secondary | ICD-10-CM

## 2021-02-23 DIAGNOSIS — E7849 Other hyperlipidemia: Secondary | ICD-10-CM | POA: Diagnosis not present

## 2021-02-23 NOTE — Patient Instructions (Signed)
Medication Instructions:  Your physician recommends that you continue on your current medications as directed. Please refer to the Current Medication list given to you today.  *If you need a refill on your cardiac medications before your next appointment, please call your pharmacy*   Lab Work: NONE If you have labs (blood work) drawn today and your tests are completely normal, you will receive your results only by: MyChart Message (if you have MyChart) OR A paper copy in the mail If you have any lab test that is abnormal or we need to change your treatment, we will call you to review the results.   Testing/Procedures: NONE   Follow-Up: At [facility], you and your health needs are our priority.  As part of our continuing mission to provide you with exceptional heart care, we have created designated Provider Care Teams.  These Care Teams include your primary Cardiologist (physician) and Advanced Practice Providers (APPs -  Physician Assistants and Nurse Practitioners) who all work together to provide you with the care you need, when you need it.  We recommend signing up for the patient portal called "MyChart".  Sign up information is provided on this After Visit Summary.  MyChart is used to connect with patients for Virtual Visits (Telemedicine).  Patients are able to view lab/test results, encounter notes, upcoming appointments, etc.  Non-urgent messages can be sent to your provider as well.   To learn more about what you can do with MyChart, go to [email or web address].    Your next appointment:   3-4 month(s)  The format for your next appointment:   In Person  Provider:   You may see Mahesh A Chandrasekhar, MD or one of the following Advanced Practice Providers on your designated Care Team:   Dayna Dunn, PA-C Michele Lenze, PA-C     

## 2021-03-11 ENCOUNTER — Telehealth (HOSPITAL_COMMUNITY): Payer: Self-pay | Admitting: *Deleted

## 2021-03-11 ENCOUNTER — Other Ambulatory Visit (HOSPITAL_COMMUNITY): Payer: Self-pay | Admitting: *Deleted

## 2021-03-11 DIAGNOSIS — Z01818 Encounter for other preprocedural examination: Secondary | ICD-10-CM

## 2021-03-11 DIAGNOSIS — R011 Cardiac murmur, unspecified: Secondary | ICD-10-CM

## 2021-03-11 DIAGNOSIS — I447 Left bundle-branch block, unspecified: Secondary | ICD-10-CM

## 2021-03-11 NOTE — Telephone Encounter (Signed)
Attempted to call patient regarding upcoming cardiac MRI appointment. Left message on voicemail with name and callback number  Alden Feagan RN Navigator Cardiac Imaging [facility] [phone] Office [phone] Cell  

## 2021-03-11 NOTE — Telephone Encounter (Signed)
Patient returning call regarding upcoming cardiac imaging study; pt verbalizes understanding of appt date/time, parking situation and where to check in, and verified current allergies; name and call back number provided for further questions should they arise  Larey Brick RN Navigator Cardiac Imaging [facility] [phone] office [phone] cell  Patient to arrive at 11:15am for pre Stress MRI EKG.

## 2021-03-15 ENCOUNTER — Ambulatory Visit (HOSPITAL_COMMUNITY)
Admission: RE | Admit: 2021-03-15 | Discharge: 2021-03-15 | Disposition: A | Discharge status: Home / Self Care | Payer: Medicare Other | Source: Ambulatory Visit | Attending: Internal Medicine | Admitting: Internal Medicine

## 2021-03-15 ENCOUNTER — Other Ambulatory Visit: Payer: Self-pay

## 2021-03-15 DIAGNOSIS — I447 Left bundle-branch block, unspecified: Secondary | ICD-10-CM | POA: Insufficient documentation

## 2021-03-15 DIAGNOSIS — Z01818 Encounter for other preprocedural examination: Secondary | ICD-10-CM | POA: Insufficient documentation

## 2021-03-15 DIAGNOSIS — R011 Cardiac murmur, unspecified: Secondary | ICD-10-CM | POA: Diagnosis present

## 2021-03-15 MED ORDER — ALBUTEROL SULFATE HFA 108 (90 BASE) MCG/ACT IN AERS
INHALATION_SPRAY | RESPIRATORY_TRACT | Status: AC
  Filled 2021-03-15: qty 6.7

## 2021-03-15 MED ORDER — AMINOPHYLLINE 25 MG/ML IV SOLN
INTRAVENOUS | Status: AC
  Filled 2021-03-15: qty 10

## 2021-03-15 MED ORDER — REGADENOSON 0.4 MG/5ML IV SOLN
0.4000 mg | Freq: Once | INTRAVENOUS | Status: AC
  Filled 2021-03-15: qty 5

## 2021-03-15 MED ORDER — REGADENOSON 0.4 MG/5ML IV SOLN
INTRAVENOUS | Status: AC
  Administered 2021-03-15: 0.4 mg via INTRAVENOUS
  Filled 2021-03-15: qty 5

## 2021-03-15 MED ORDER — NITROGLYCERIN 0.4 MG SL SUBL
SUBLINGUAL_TABLET | SUBLINGUAL | Status: AC
  Filled 2021-03-15: qty 1

## 2021-03-15 MED ORDER — GADOBUTROL 1 MMOL/ML IV SOLN
10.0000 mL | Freq: Once | INTRAVENOUS | Status: AC | PRN
  Administered 2021-03-15: 10 mL via INTRAVENOUS

## 2021-03-15 MED ORDER — METOPROLOL TARTRATE 5 MG/5ML IV SOLN
INTRAVENOUS | Status: AC
  Filled 2021-03-15: qty 5

## 2021-03-15 NOTE — Progress Notes (Signed)
   Called to procter cardiac MRI  Risks and benefits of regadenoson discussed w/ patient, including flushing, HA, nausea, palpitations. Pt understands and agrees to proceed.  ECG reviewed, LBBB is present QRS duration 156 ms ECG 02/23/2021 w/ QRS duration 154 ms ECG 12/31/2020 w/ QRS duration 156 ms ECG 09/25/2020 w/ QRS duration 100 ms, multiple leads w/ T wave inversions. ECG 03/23/2020 w/ QRS duration 171 ms  Unclear  why the 08/2020 ECG was so different from those before and after.  Pt tolerated the regandenoson very well, minimal sx. No BP/HR problems.  Results pending.  Theodore Demark, PA-C 03/15/2021 12:58 PM

## 2021-03-15 NOTE — Progress Notes (Signed)
Post Lexi-scan administration.  Pt denies any symptoms.

## 2021-03-22 ENCOUNTER — Telehealth: Payer: Self-pay | Admitting: Internal Medicine

## 2021-03-22 NOTE — Telephone Encounter (Signed)
Called Patient with two identifies in regard to Cardiac MIR results Discussed that patient has no CP, SOB, Palpitations and feels fine Answered questions in regard to that the LBBB and lead to a LVEF at the Lower limited or normal, and that the perfusion defect may be related to LBBB and artifact.  There was no evidence of scar on his heart.  Assessment LBB and LVEF at lower limited of normal  Plan  If any cardiac sx as above will pursue CT or invastive testing; otherwise will make no changes until 2023 f/u  Patient had no further questions.  Riley Lam, MD Cardiologist [facility]  [address] [place], [address] [place], [place] [address] [phone]  2:38 PM
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
## 2021-06-01 ENCOUNTER — Encounter: Payer: Self-pay | Admitting: Internal Medicine

## 2021-06-01 ENCOUNTER — Other Ambulatory Visit: Payer: Self-pay

## 2021-06-01 ENCOUNTER — Ambulatory Visit: Payer: Medicaid Other | Admitting: Internal Medicine

## 2021-06-01 VITALS — BP 158/80 | HR 81 | Ht 67.5 in | Wt 269.0 lb

## 2021-06-01 DIAGNOSIS — I447 Left bundle-branch block, unspecified: Secondary | ICD-10-CM

## 2021-06-01 DIAGNOSIS — R011 Cardiac murmur, unspecified: Secondary | ICD-10-CM | POA: Diagnosis not present

## 2021-06-01 DIAGNOSIS — E7849 Other hyperlipidemia: Secondary | ICD-10-CM

## 2021-06-01 NOTE — Patient Instructions (Signed)
Medication Instructions:  Your physician recommends that you continue on your current medications as directed. Please refer to the Current Medication list given to you today.  *If you need a refill on your cardiac medications before your next appointment, please call your pharmacy*   Lab Work: NONE If you have labs (blood work) drawn today and your tests are completely normal, you will receive your results only by: MyChart Message (if you have MyChart) OR A paper copy in the mail If you have any lab test that is abnormal or we need to change your treatment, we will call you to review the results.   Testing/Procedures: NONE   Follow-Up: At [facility], you and your health needs are our priority.  As part of our continuing mission to provide you with exceptional heart care, we have created designated Provider Care Teams.  These Care Teams include your primary Cardiologist (physician) and Advanced Practice Providers (APPs -  Physician Assistants and Nurse Practitioners) who all work together to provide you with the care you need, when you need it.  We recommend signing up for the patient portal called "MyChart".  Sign up information is provided on this After Visit Summary.  MyChart is used to connect with patients for Virtual Visits (Telemedicine).  Patients are able to view lab/test results, encounter notes, upcoming appointments, etc.  Non-urgent messages can be sent to your provider as well.   To learn more about what you can do with MyChart, go to [email or web address].    Your next appointment:   5-6 month(s)  The format for your next appointment:   In Person  Provider:   Christell Constant, MD     Other Instructions Please start performing BP checks as discussed with Dr. Izora Ribas

## 2021-09-30 ENCOUNTER — Other Ambulatory Visit: Payer: Self-pay | Admitting: Internal Medicine

## 2021-10-01 LAB — COMPLETE METABOLIC PANEL WITH GFR
AG Ratio: 1.2 (calc) (ref 1.0–2.5)
ALT: 34 U/L (ref 9–46)
AST: 31 U/L (ref 10–35)
Albumin: 4.2 g/dL (ref 3.6–5.1)
Alkaline phosphatase (APISO): 77 U/L (ref 35–144)
BUN: 12 mg/dL (ref 7–25)
CO2: 24 mmol/L (ref 20–32)
Calcium: 8.9 mg/dL (ref 8.6–10.3)
Chloride: 103 mmol/L (ref 98–110)
Creat: 1.17 mg/dL (ref 0.70–1.35)
Globulin: 3.6 g/dL (calc) (ref 1.9–3.7)
Glucose, Bld: 102 mg/dL — ABNORMAL HIGH (ref 65–99)
Potassium: 4.5 mmol/L (ref 3.5–5.3)
Sodium: 138 mmol/L (ref 135–146)
Total Bilirubin: 0.4 mg/dL (ref 0.2–1.2)
Total Protein: 7.8 g/dL (ref 6.1–8.1)
eGFR: 69 mL/min/{1.73_m2} (ref >=60)

## 2021-10-01 LAB — CBC
HCT: 39.5 % (ref 38.5–50.0)
Hemoglobin: 13 g/dL — ABNORMAL LOW (ref 13.2–17.1)
MCH: 27.7 pg (ref 27.0–33.0)
MCHC: 32.9 g/dL (ref 32.0–36.0)
MCV: 84.2 fL (ref 80.0–100.0)
MPV: 12.7 fL — ABNORMAL HIGH (ref 7.5–12.5)
Platelets: 216 10*3/uL (ref 140–400)
RBC: 4.69 10*6/uL (ref 4.20–5.80)
RDW: 13.1 % (ref 11.0–15.0)
WBC: 5.3 10*3/uL (ref 3.8–10.8)

## 2021-10-01 LAB — PSA: PSA: 1.29 ng/mL (ref <=4.00)

## 2021-10-01 LAB — LIPID PANEL
Cholesterol: 153 mg/dL (ref <200)
HDL: 59 mg/dL (ref >=40)
LDL Cholesterol (Calc): 78 mg/dL (calc)
Non-HDL Cholesterol (Calc): 94 mg/dL (calc) (ref <130)
Total CHOL/HDL Ratio: 2.6 (calc) (ref <5.0)
Triglycerides: 80 mg/dL (ref <150)

## 2021-10-01 LAB — VITAMIN D 25 HYDROXY (VIT D DEFICIENCY, FRACTURES): Vit D, 25-Hydroxy: 61 ng/mL (ref 30–100)

## 2021-10-01 LAB — URIC ACID: Uric Acid, Serum: 6.9 mg/dL (ref 4.0–8.0)

## 2021-10-01 LAB — TSH: TSH: 3.06 mIU/L (ref 0.40–4.50)

## 2021-10-18 ENCOUNTER — Telehealth: Payer: Self-pay

## 2021-10-18 NOTE — Telephone Encounter (Signed)
Called pt in regards to OV scheduled for 10/20/21 at 9:20 am.  MD reports pt was to be a 1 yr f/u.  Called pt denies having cardiac difficulty.  Only reports fatigue which is normal for pt.  Advised pt that will cancel 10/20/21 OV and if pt needs to be seen prior to Jan 2024 to call into the office for an appointment. Pt reports understands no questions or concerns.
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
## 2021-10-20 ENCOUNTER — Ambulatory Visit: Payer: Medicare Other | Admitting: Internal Medicine

## 2022-02-05 ENCOUNTER — Other Ambulatory Visit: Payer: Self-pay | Admitting: Internal Medicine

## 2022-07-07 ENCOUNTER — Encounter: Payer: Self-pay | Admitting: Internal Medicine

## 2022-07-07 ENCOUNTER — Ambulatory Visit: Payer: Medicare Other | Attending: Internal Medicine | Admitting: Internal Medicine

## 2022-07-07 VITALS — BP 162/86 | HR 64 | Ht 67.5 in | Wt 267.0 lb

## 2022-07-07 DIAGNOSIS — E7849 Other hyperlipidemia: Secondary | ICD-10-CM | POA: Diagnosis present

## 2022-07-07 DIAGNOSIS — I447 Left bundle-branch block, unspecified: Secondary | ICD-10-CM | POA: Diagnosis present

## 2022-07-07 DIAGNOSIS — I10 Essential (primary) hypertension: Secondary | ICD-10-CM | POA: Insufficient documentation

## 2022-07-07 MED ORDER — HYDROCHLOROTHIAZIDE 25 MG PO TABS: 25.0000 mg | ORAL_TABLET | Freq: Every day | ORAL | 3 refills | Status: AC

## 2022-07-07 NOTE — Patient Instructions (Signed)
Medication Instructions:  Your physician has recommended you make the following change in your medication:   Start hydrochlorothiazide 25mg  Daily    *If you need a refill on your cardiac medications before your next appointment, please call your pharmacy*   Lab Work: BMET  1- 2 weeks If you have labs (blood work) drawn today and your tests are completely normal, you will receive your results only by: [place] (if you have MyChart) OR A paper copy in the mail If you have any lab test that is abnormal or we need to change your treatment, we will call you to review the results.   Follow-Up: At [facility], you and your health needs are our priority.  As part of our continuing mission to provide you with exceptional heart care, we have created designated Provider Care Teams.  These Care Teams include your primary Cardiologist (physician) and Advanced Practice Providers (APPs -  Physician Assistants and Nurse Practitioners) who all work together to provide you with the care you need, when you need it.  We recommend signing up for the patient portal called "MyChart".  Sign up information is provided on this After Visit Summary.  MyChart is used to connect with patients for Virtual Visits (Telemedicine).  Patients are able to view lab/test results, encounter notes, upcoming appointments, etc.  Non-urgent messages can be sent to your provider as well.   To learn more about what you can do with MyChart, go to [email or web address].    Your next appointment:   1 year(s)  Provider:   Werner Lean, MD
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
## 2022-07-21 ENCOUNTER — Ambulatory Visit: Payer: Medicare Other | Attending: Internal Medicine

## 2022-07-21 LAB — BASIC METABOLIC PANEL
BUN/Creatinine Ratio: 13 (ref 10–24)
BUN: 16 mg/dL (ref 8–27)
CO2: 24 mmol/L (ref 20–29)
Calcium: 9.8 mg/dL (ref 8.6–10.2)
Chloride: 99 mmol/L (ref 96–106)
Creatinine, Ser: 1.26 mg/dL (ref 0.76–1.27)
Glucose: 114 mg/dL — ABNORMAL HIGH (ref 70–99)
Potassium: 4.1 mmol/L (ref 3.5–5.2)
Sodium: 137 mmol/L (ref 134–144)
eGFR: 63 mL/min/{1.73_m2} (ref >59)

## 2022-08-05 ENCOUNTER — Other Ambulatory Visit: Payer: Self-pay | Admitting: Internal Medicine

## 2023-08-27 ENCOUNTER — Emergency Department (HOSPITAL_COMMUNITY)
Admission: EM | Admit: 2023-08-27 | Discharge: 2023-08-27 | Disposition: A | Discharge status: Home / Self Care | Attending: Emergency Medicine | Admitting: Emergency Medicine

## 2023-08-27 ENCOUNTER — Emergency Department (HOSPITAL_COMMUNITY)

## 2023-08-27 DIAGNOSIS — M5412 Radiculopathy, cervical region: Secondary | ICD-10-CM | POA: Insufficient documentation

## 2023-08-27 DIAGNOSIS — M25511 Pain in right shoulder: Secondary | ICD-10-CM | POA: Diagnosis present

## 2023-08-27 DIAGNOSIS — Z7982 Long term (current) use of aspirin: Secondary | ICD-10-CM | POA: Diagnosis not present

## 2023-08-27 DIAGNOSIS — I10 Essential (primary) hypertension: Secondary | ICD-10-CM | POA: Diagnosis not present

## 2023-08-27 DIAGNOSIS — Z79899 Other long term (current) drug therapy: Secondary | ICD-10-CM | POA: Diagnosis not present

## 2023-08-27 DIAGNOSIS — R030 Elevated blood-pressure reading, without diagnosis of hypertension: Secondary | ICD-10-CM

## 2023-08-27 DIAGNOSIS — E119 Type 2 diabetes mellitus without complications: Secondary | ICD-10-CM | POA: Diagnosis not present

## 2023-08-27 MED ORDER — CELECOXIB 200 MG PO CAPS: 200.0000 mg | ORAL_CAPSULE | Freq: Two times a day (BID) | ORAL | 0 refills | Status: AC

## 2023-08-27 MED ORDER — DEXAMETHASONE SODIUM PHOSPHATE 10 MG/ML IJ SOLN
10.0000 mg | Freq: Once | INTRAMUSCULAR | Status: AC
  Administered 2023-08-27: 10 mg via INTRAMUSCULAR
  Filled 2023-08-27: qty 1

## 2023-08-27 MED ORDER — DEXAMETHASONE SODIUM PHOSPHATE 10 MG/ML IJ SOLN: 10.0000 mg | Freq: Once | INTRAMUSCULAR | Status: DC

## 2023-08-27 MED ORDER — TRAMADOL HCL 50 MG PO TABS: 50.0000 mg | ORAL_TABLET | Freq: Four times a day (QID) | ORAL | 0 refills | Status: AC | PRN

## 2023-08-27 NOTE — Discharge Instructions (Signed)
 Contact a health care provider if: Your condition does not improve with treatment. Get help right away if: Your pain gets much worse and is not controlled with medicines. You have weakness or numbness in your hand, arm, face, or leg. You have a high fever. You have a stiff, rigid neck. You lose control of your bowels or your bladder (have incontinence). You have trouble with walking, balance, or speaking.

## 2023-08-27 NOTE — ED Triage Notes (Signed)
 Pt states that he has been having R shoulder pain for several weeks. He states that he did exert himself in lifting something heavy several weeks before the pain started, but otherwise unknown injury. NAD noted during triage.

## 2023-08-27 NOTE — ED Provider Notes (Signed)
 [place] EMERGENCY DEPARTMENT AT [facility] Provider Note   CSN: [id number] Arrival date & time: 08/27/23  1129     History  Chief Complaint  Patient presents with   Shoulder Pain    Jason Hamilton is a 68 y.o. male hx of hyperlipidemia, hypertension, T2DM who presents to the ED with R shoulder pain.The pain began this week, however 3 weeks ago he was lifting up the trailer that attaches to his riding lawnmower onto a top shelf in his storage.  He supported it on his right shoulder and right hand like holding a serving tray.  He did not initially have pain but now has severe pain in his right arm when he moves his neck certain ways.  He also has numbness and tingling down the anterior part of his arm into his first and second fingers.  He denies weakness.  He was seen recently by PCP who told him that he probably had arthritis and gave him a shot of Toradol and some Flexeril.  He has had difficulty sleeping due to the pain rating down his arm.    Shoulder Pain      Home Medications Prior to Admission medications   Medication Sig Start Date End Date Taking? Authorizing Provider  celecoxib (CELEBREX) 200 MG capsule Take 1 capsule (200 mg total) by mouth 2 (two) times daily. 08/27/23  Yes Dorothey Oetken, PA-C  traMADol (ULTRAM) 50 MG tablet Take 1 tablet (50 mg total) by mouth every 6 (six) hours as needed. 08/27/23  Yes Shelisha Gautier, PA-C  acetaminophen (TYLENOL) 500 MG tablet Take 500 mg by mouth as needed.    [provider]  allopurinol (ZYLOPRIM) 100 MG tablet Take 100 mg by mouth daily. 05/04/15   [provider]  aspirin EC ([employer] ASPIRIN LOW DOSE) 81 MG tablet TAKE 1 TABLET (81 MG TOTAL) BY MOUTH DAILY. SWALLOW WHOLE. 08/05/22   Riley Lam A, MD  atorvastatin (LIPITOR) 10 MG tablet Take 10 mg by mouth daily.    [provider]  cetirizine (ZYRTEC) 10 MG tablet Take 10 mg by mouth daily.    [provider]  diclofenac  Sodium (VOLTAREN) 1 % GEL Apply 4 g topically 4 (four) times daily as needed.    [provider]  Ergocalciferol (VITAMIN D2 PO) Take 1,250 mcg by mouth once a week.    [provider]  fluticasone (FLONASE) 50 MCG/ACT nasal spray Place 2 sprays into both nostrils daily as needed for allergies. 07/10/19   [provider]  hydrochlorothiazide (HYDRODIURIL) 25 MG tablet Take 1 tablet (25 mg total) by mouth daily. 07/07/22   Chandrasekhar, Mahesh A, MD  pantoprazole (PROTONIX) 40 MG tablet Take 40 mg by mouth daily.    [provider]      Allergies    Patient has no known allergies.    Review of Systems   Review of Systems  Physical Exam Updated Vital Signs BP (!) 196/90 (BP Location: Left Arm)   Pulse 76   Temp 98.1 F (36.7 C) (Oral)   Resp 18   Ht 5\' 8"  (1.727 m)   Wt 117.9 kg   SpO2 94%   BMI 39.53 kg/m  Physical Exam Vitals and nursing note reviewed.  Constitutional:      General: He is not in acute distress.    Appearance: He is well-developed. He is not diaphoretic.  HENT:     Head: Normocephalic and atraumatic.  Eyes:     General:  No scleral icterus.    Conjunctiva/sclera: Conjunctivae normal.  Cardiovascular:     Rate and Rhythm: Normal rate and regular rhythm.     Heart sounds: Normal heart sounds.  Pulmonary:     Effort: Pulmonary effort is normal. No respiratory distress.     Breath sounds: Normal breath sounds.  Abdominal:     Palpations: Abdomen is soft.     Tenderness: There is no abdominal tenderness.  Musculoskeletal:     Cervical back: Normal range of motion and neck supple.     Comments: Full range of motion of his right shoulder without severe pain.  No tenderness to palpation of the structures of the right shoulder.  He is tender in the supraclavicular region.  Pain with neck movement shoots down the arm.  Normal radial pulse, negative Hoffman test, equal bilateral grip strength  Skin:    General: Skin is warm and  dry.  Neurological:     Mental Status: He is alert.  Psychiatric:        Behavior: Behavior normal.     ED Results / Procedures / Treatments   Labs (all labs ordered are listed, but only abnormal results are displayed) Labs Reviewed - No data to display  EKG None  Radiology DG Shoulder Right Result Date: 08/27/2023 CLINICAL DATA:  Right shoulder pain EXAM: RIGHT SHOULDER - 2+ VIEW COMPARISON:  None Available. FINDINGS: There is no evidence of fracture or dislocation. Mild-to-moderate degenerative changes of the glenohumeral and acromioclavicular joints. Soft tissues are unremarkable. IMPRESSION: Mild-to-moderate degenerative changes of the right shoulder. No acute findings. Electronically Signed   By: Duanne Guess D.O.   On: 08/27/2023 12:41    Procedures Procedures    Medications Ordered in ED Medications  dexamethasone (DECADRON) injection 10 mg (has no administration in time range)    ED Course/ Medical Decision Making/ A&P                                 Medical Decision Making Here with right arm pain most likely cervical radiculopathy however thoracic outlet brachial plexopathy also within the differential.  Suspect that this is likely secondary to injury from lifting the heavy weight on his right side.  Case discussed with Dr. Juanita Craver who recommends IM steroid, pain control and close outpatient follow-up with him in his office for outpatient MRI.  Patient agreeable with plan.  He understands that this will elevate his blood sugars.  He has no signs of myelopathy and appears otherwise appropriate for discharge at this time  Amount and/or Complexity of Data Reviewed Radiology: ordered.  Risk Prescription drug management.           Final Clinical Impression(s) / ED Diagnoses Final diagnoses:  Cervical radiculopathy  Elevated blood pressure reading    Rx / DC Orders ED Discharge Orders          Ordered    traMADol (ULTRAM) 50 MG tablet  Every 6  hours PRN        08/27/23 1353    celecoxib (CELEBREX) 200 MG capsule  2 times daily        08/27/23 1353              Arthor Captain, PA-C 08/27/23 1405    Lorre Nick, MD 08/29/23 1127

## 2023-09-01 ENCOUNTER — Encounter: Payer: Self-pay | Admitting: Physical Medicine and Rehabilitation

## 2023-09-01 ENCOUNTER — Ambulatory Visit: Admitting: Physical Medicine and Rehabilitation

## 2023-09-01 ENCOUNTER — Other Ambulatory Visit (INDEPENDENT_AMBULATORY_CARE_PROVIDER_SITE_OTHER): Payer: Self-pay

## 2023-09-01 DIAGNOSIS — M5412 Radiculopathy, cervical region: Secondary | ICD-10-CM

## 2023-09-01 DIAGNOSIS — M25512 Pain in left shoulder: Secondary | ICD-10-CM | POA: Diagnosis not present

## 2023-09-01 DIAGNOSIS — R202 Paresthesia of skin: Secondary | ICD-10-CM | POA: Diagnosis not present

## 2023-09-01 DIAGNOSIS — M25511 Pain in right shoulder: Secondary | ICD-10-CM

## 2023-09-01 NOTE — Progress Notes (Signed)
 Pain Scale   Average Pain 10 Patient advising his pain radiates down his right shoulder anf arm causing numbness and pain        +Driver, -BT, -Dye Allergies.
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
## 2023-09-15 ENCOUNTER — Ambulatory Visit
Admission: RE | Admit: 2023-09-15 | Discharge: 2023-09-15 | Disposition: A | Discharge status: Home / Self Care | Source: Ambulatory Visit | Attending: Physical Medicine and Rehabilitation | Admitting: Physical Medicine and Rehabilitation

## 2023-09-29 IMAGING — MR MR CARDIAC FOR FUNCTION WO/W CM
45 of 48 series · 45 of 48 positions shown · IV contrast (Contrast agent)
Comparison: none

CLINICAL DATA: Stress ARJUN to evaluate for ischemia

[Series 4: t2_haste_db_tra_bh · axial · 8.0mm · 1.60mm/px · 1 of 18 slices shown]
[im 1/18]
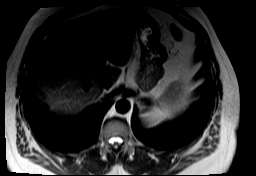

[Series 8: bSSFP · oblique · 6.0mm · 1.41mm/px · 1 of 25 slices shown (1 of 19)]
[im 1/25]
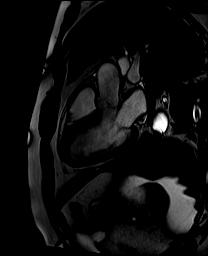

[Series 9: bSSFP · sagittal · 6.0mm · 1.41mm/px · 1 of 25 slices shown (2 of 19)]
[im 1/25]
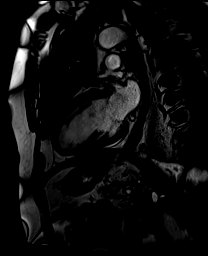

[Series 10: bSSFP · axial · 6.0mm · 1.41mm/px · 1 of 25 slices shown (3 of 19)]
[im 1/25]
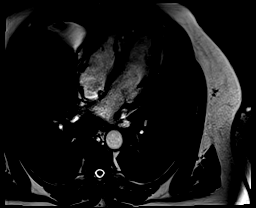

[Series 11: (id)_long_t1 · coronal · 8.0mm · 1.56mm/px · 1 of 24 slices shown]
[im 1/24]
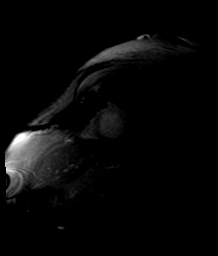

[Series 12: (id)_long_t1_moco · coronal · 8.0mm · 1.56mm/px · 1 of 24 slices shown]
[im 1/24]
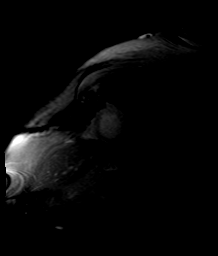

[Series 13: (id)_long_t1_moco_t1 · coronal · 8.0mm · 1.56mm/px · 1 of 6 slices shown]
[im 1/6]
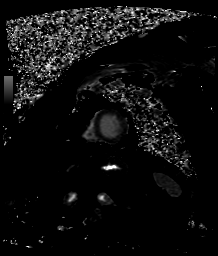

[Series 15: (id)_trufi · coronal · 8.0mm · 2.08mm/px · 1 of 9 slices shown]
[im 1/9]
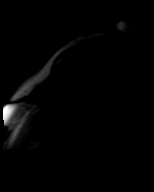

[Series 16: (id)_trufi_moco · coronal · 8.0mm · 2.08mm/px · 1 of 9 slices shown]
[im 1/9]
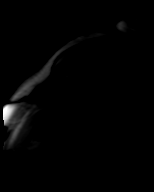

[Series 17: (id)_trufi_moco_t2 · coronal · 8.0mm · 2.08mm/px · 1 of 3 slices shown]
[im 1/3]
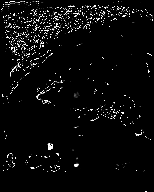

[Series 19: pre short axis · coronal · non-contrast · 8.0mm · 2.25mm/px · 1 of 10 slices shown (1 of 6)]
[im 1/10]
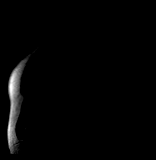

[Series 20: pre short axis · coronal · non-contrast · 8.0mm · 2.25mm/px · 1 of 10 slices shown (2 of 6)]
[im 1/10]
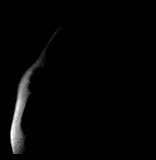

[Series 21: pre short axis · coronal · non-contrast · 8.0mm · 2.25mm/px · 1 of 10 slices shown (3 of 6)]
[im 1/10]
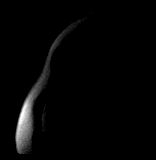

[Series 22: pre short axis · coronal · non-contrast · 8.0mm · 2.25mm/px · 1 of 10 slices shown (4 of 6)]
[im 1/10]
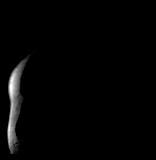

[Series 23: pre short axis · coronal · non-contrast · 8.0mm · 2.25mm/px · 1 of 10 slices shown (5 of 6)]
[im 1/10]
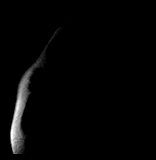

[Series 24: pre short axis · coronal · non-contrast · 8.0mm · 2.25mm/px · 1 of 10 slices shown (6 of 6)]
[im 1/10]
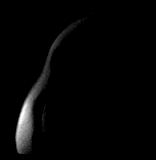

[Series 25: stress short axis · coronal · 8.0mm · 2.25mm/px · 1 of 60 slices shown (1 of 6)]
[im 1/60]
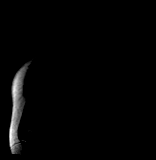

[Series 26: stress short axis · coronal · 8.0mm · 2.25mm/px · 1 of 60 slices shown (2 of 6)]
[im 1/60]
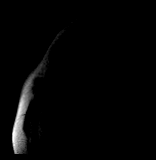

[Series 27: stress short axis · coronal · 8.0mm · 2.25mm/px · 1 of 60 slices shown (3 of 6)]
[im 1/60]
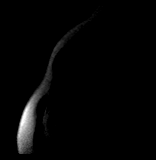

[Series 28: stress short axis · coronal · 8.0mm · 2.25mm/px · 1 of 60 slices shown (4 of 6)]
[im 1/60]
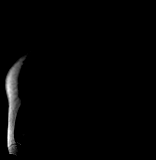

[Series 29: stress short axis · coronal · 8.0mm · 2.25mm/px · 1 of 60 slices shown (5 of 6)]
[im 1/60]
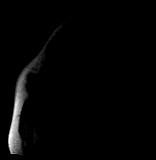

[Series 30: stress short axis · coronal · 8.0mm · 2.25mm/px · 1 of 60 slices shown (6 of 6)]
[im 1/60]
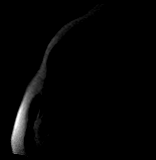

[Series 31: bSSFP · coronal · 8.0mm · 1.61mm/px · 1 of 25 slices shown (4 of 19)]
[im 1/25]
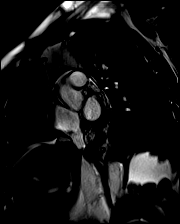

[Series 32: bSSFP · coronal · 8.0mm · 1.61mm/px · 1 of 25 slices shown (5 of 19)]
[im 1/25]
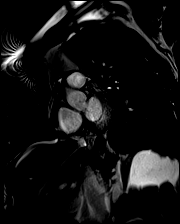

[Series 33: bSSFP · coronal · 8.0mm · 1.61mm/px · 1 of 25 slices shown (6 of 19)]
[im 1/25]
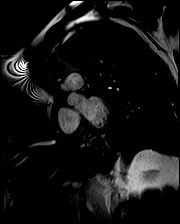

[Series 34: bSSFP · coronal · 8.0mm · 1.61mm/px · 1 of 25 slices shown (7 of 19)]
[im 1/25]
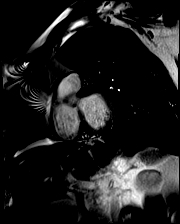

[Series 35: bSSFP · coronal · 8.0mm · 1.61mm/px · 1 of 25 slices shown (8 of 19)]
[im 1/25]
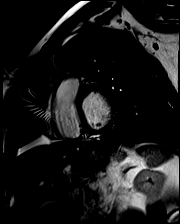

[Series 36: bSSFP · coronal · 8.0mm · 1.61mm/px · 1 of 25 slices shown (9 of 19)]
[im 1/25]
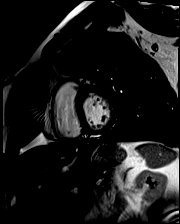

[Series 37: bSSFP · coronal · 8.0mm · 1.61mm/px · 1 of 25 slices shown (10 of 19)]
[im 1/25]
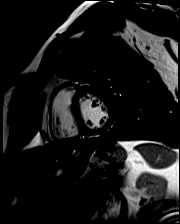

[Series 38: bSSFP · coronal · 8.0mm · 1.61mm/px · 1 of 25 slices shown (11 of 19)]
[im 1/25]
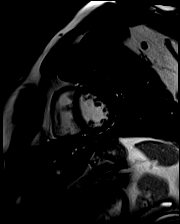

[Series 39: bSSFP · coronal · 8.0mm · 1.61mm/px · 1 of 25 slices shown (12 of 19)]
[im 1/25]
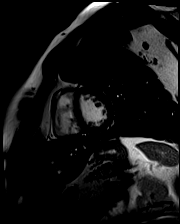

[Series 40: bSSFP · coronal · 8.0mm · 1.61mm/px · 1 of 25 slices shown (13 of 19)]
[im 1/25]
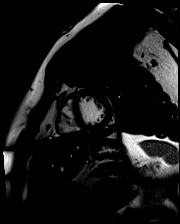

[Series 41: bSSFP · coronal · 8.0mm · 1.61mm/px · 1 of 25 slices shown (14 of 19)]
[im 1/25]
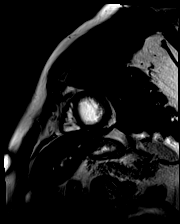

[Series 42: bSSFP · coronal · 8.0mm · 1.61mm/px · 1 of 25 slices shown (15 of 19)]
[im 1/25]
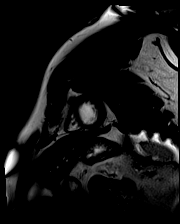

[Series 43: bSSFP · coronal · 8.0mm · 1.61mm/px · 1 of 25 slices shown (16 of 19)]
[im 1/25]
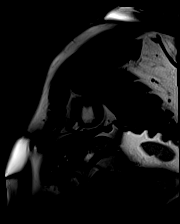

[Series 44: bSSFP · coronal · 8.0mm · 1.61mm/px · 1 of 25 slices shown (17 of 19)]
[im 1/25]
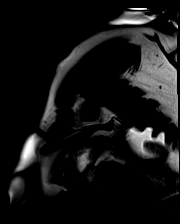

[Series 45: bSSFP · coronal · 8.0mm · 1.61mm/px · 1 of 25 slices shown (18 of 19)]
[im 1/25]
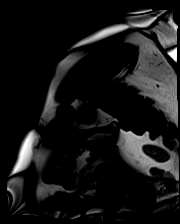

[Series 48: STIR · axial · 6.0mm · 1.97mm/px · 1 of 1 slices shown]
[im 1/1]
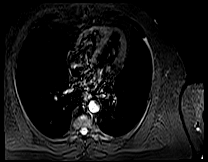

[Series 49: rest short axis · coronal · 8.0mm · 2.25mm/px · 1 of 60 slices shown (1 of 6)]
[im 1/60]
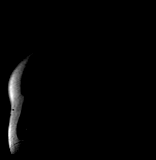

[Series 50: rest short axis · coronal · 8.0mm · 2.25mm/px · 1 of 60 slices shown (2 of 6)]
[im 1/60]
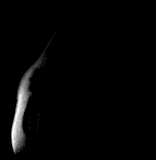

[Series 51: rest short axis · coronal · 8.0mm · 2.25mm/px · 1 of 60 slices shown (3 of 6)]
[im 1/60]
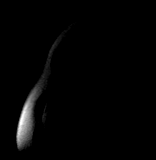

[Series 52: rest short axis · coronal · 8.0mm · 2.25mm/px · 1 of 60 slices shown (4 of 6)]
[im 1/60]
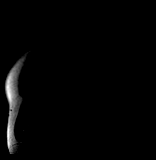

[Series 53: rest short axis · coronal · 8.0mm · 2.25mm/px · 1 of 60 slices shown (5 of 6)]
[im 1/60]
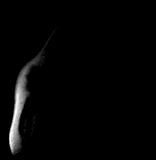

[Series 54: rest short axis · coronal · 8.0mm · 2.25mm/px · 1 of 60 slices shown (6 of 6)]
[im 1/60]
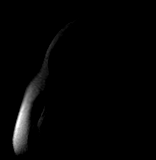

[Series 55: bSSFP · coronal · 6.0mm · 1.41mm/px · 1 of 25 slices shown (19 of 19)]
[im 1/25]
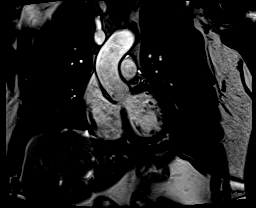

[45 of 48 positions shown; findings below may reference images not displayed]

EXAM:
CARDIAC MRI

PROCEDURE:
PROCEDURE
The patient was scanned on a 1.5 Tesla Siemens magnet. A dedicated
cardiac coil was used. Functional imaging was done using [employer]
sequences. [DATE], and 4 chamber views were done to assess for RWMA's.
Modified Agazzi rule using a short axis stack was used to
calculate an ejection fraction on a dedicated work station using

Circle software. Rest and stress (following administration of
regadenoson 0.4mg) first-pass contrast-enhanced imaging was done.
The patient received 10 cc of Gadavist. After 10 minutes inversion
recovery sequences were used to assess for infiltration and scar
tissue.

CONTRAST:  10 cc  of Gadavist
FINDINGS: Left ventricle:

LV EF: 51 % (Normal 56-78%)

Absolute volumes:

LV EDV: 167 mL (Normal 52-141 mL)

LV ESV:  82mL (Normal 13-51 mL)

LV SV: 85 mL (Normal 33-97 mL)

CO: 6.2 L/min (Normal 2.7-6.0 L/min)

Indexed volumes:

LV EDV: 70 mL/sq-m (Normal 41-81 mL/sq-m)

LV ESV: 34 mL/sq-m (Normal 12-21 mL/sq-m)

LV SV: 36  mL/sq-m (Normal 26-56 mL/sq-m)

CI: L/2.6  min/sq-m (Normal 1.8-3.8 L/min/sq-m)

Mildly reduced LVEF

Septal wall motion abnormality consistent with LBBB.

There is a perfusion defect in the apical septal, apical inferior,
mid anteroseptal and mid inferoseptal that persists greater than 8
frames from the arrival of contrast in the left ventricular cavity.

No LGE, normal pre-contrast T1, normal T2.

Right ventricle:

RV female

RV EF: 51 % (Normal 47-80%)

Absolute volumes:

RV EDV:168 mL (Normal 58-154 mL)

RV ESV: 82 mL (Normal 12-68 mL)

RV SV: 85 mL (Normal 35-98 mL)

CO: 6.2 L/min (Normal 2.7-6 L/min)

Indexed volumes:

RV EDV: 70 mL/sq-m (Normal 48-87 mL/sq-m)

RV ESV: 34 mL/sq-m (Normal 11-28 mL/sq-m)

RV SV: 36 mL/sq-m (Normal 27-57 mL/sq-m)

CI: 2.6 L/min/sq-m (Normal 1.8-3.8 L/min/sq-m)

Left atrium: Normal size

Right atrium: Normal size

Mitral valve: No regurgitation

Aortic valve: No regurgitation

Tricuspid valve: No regurgitation

Pulmonic valve: No regurgitation

Aorta: Normal proximal ascending aorta

Pericardium: Normal

Extracardiac structures: No significant findings
IMPRESSION: Positive Stress Test (septal perfusion defect), without scar and
mildly reduced LVEF.

## 2023-10-06 ENCOUNTER — Ambulatory Visit (INDEPENDENT_AMBULATORY_CARE_PROVIDER_SITE_OTHER): Admitting: Physical Medicine and Rehabilitation

## 2023-10-06 ENCOUNTER — Encounter: Payer: Self-pay | Admitting: Physical Medicine and Rehabilitation

## 2023-10-06 DIAGNOSIS — M25511 Pain in right shoulder: Secondary | ICD-10-CM

## 2023-10-06 DIAGNOSIS — R202 Paresthesia of skin: Secondary | ICD-10-CM | POA: Diagnosis not present

## 2023-10-06 DIAGNOSIS — G8929 Other chronic pain: Secondary | ICD-10-CM

## 2023-10-06 DIAGNOSIS — M5412 Radiculopathy, cervical region: Secondary | ICD-10-CM

## 2023-10-06 NOTE — Progress Notes (Unsigned)
 Pain Scale   Average Pain 4 Patient advised his neck pain radiates bilaterally to both shoulders.Patient here for MRI review.        +Driver, -BT, -Dye Allergies.
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
